# Patient Record
Sex: Male | Born: 1950
Health system: Southern US, Community
[De-identification: ages and names within clinical notes are randomized; demographics above are authoritative.]

## PROBLEM LIST (undated history)

## (undated) DIAGNOSIS — R011 Cardiac murmur, unspecified: Secondary | ICD-10-CM

## (undated) DIAGNOSIS — I10 Essential (primary) hypertension: Secondary | ICD-10-CM

## (undated) HISTORY — PX: CARDIAC CATHETERIZATION: SHX172

## (undated) HISTORY — PX: HERNIA REPAIR: SHX51

---

## 2004-11-04 ENCOUNTER — Encounter: Payer: Self-pay | Admitting: Cardiology

## 2004-11-04 ENCOUNTER — Ambulatory Visit (HOSPITAL_COMMUNITY): Admission: RE | Admit: 2004-11-04 | Discharge: 2004-11-04 | Payer: Self-pay | Admitting: Cardiology

## 2006-01-10 ENCOUNTER — Other Ambulatory Visit: Admission: RE | Admit: 2006-01-10 | Discharge: 2006-01-10 | Payer: Self-pay | Admitting: Otolaryngology

## 2010-02-23 ENCOUNTER — Ambulatory Visit (HOSPITAL_BASED_OUTPATIENT_CLINIC_OR_DEPARTMENT_OTHER): Admission: RE | Admit: 2010-02-23 | Discharge: 2010-02-23 | Payer: Self-pay | Admitting: Surgery

## 2010-08-12 LAB — BASIC METABOLIC PANEL
BUN: 13 mg/dL (ref 6–23)
Calcium: 9 mg/dL (ref 8.4–10.5)
Creatinine, Ser: 0.77 mg/dL (ref 0.4–1.5)
GFR calc non Af Amer: >60 mL/min (ref >60)
Glucose, Bld: 89 mg/dL (ref 70–99)

## 2010-08-12 LAB — DIFFERENTIAL
Basophils Absolute: 0 10*3/uL (ref 0.0–0.1)
Basophils Relative: 0 % (ref 0–1)
Eosinophils Absolute: 0 10*3/uL (ref 0.0–0.7)
Monocytes Absolute: 0.4 10*3/uL (ref 0.1–1.0)
Neutro Abs: 5 10*3/uL (ref 1.7–7.7)
Neutrophils Relative %: 75 % (ref 43–77)

## 2010-08-12 LAB — CBC
MCHC: 35 g/dL (ref 30.0–36.0)
RDW: 13.1 % (ref 11.5–15.5)

## 2010-10-15 NOTE — Cardiovascular Report (Signed)
Daniel Krueger, Daniel Krueger              ACCOUNT NO.:  [id number]   MEDICAL RECORD NO.:  [id number]          PATIENT TYPE:  OIB   LOCATION:  2859                         FACILITY:  [facility]   PHYSICIAN:  Armanda Magic, M.D.     DATE OF BIRTH:  1950/10/24   DATE OF PROCEDURE:  11/04/2004  DATE OF DISCHARGE:  11/04/2004                              CARDIAC CATHETERIZATION   REFERRING PHYSICIAN:  Vikki Ports, M.D.   PROCEDURE:  1.  Left heart catheterization.  2.  Coronary angiography.  3.  Left ventriculography.   OPERATOR:  Armanda Magic, M.D.   INDICATIONS:  Abnormal EKG and Cardiolite.   COMPLICATIONS:  None.   IV ACCESS:  Via right femoral artery 6-French sheath.   HISTORY:  This is a very pleasant 60 year old white male with no previous  cardiac history, who presented for evaluation of abnormal EKG and stress  Cardiolite study.  These showed a reversible perfusion defect.  The patient  now presents for cardiac catheterization.   DESCRIPTION OF PROCEDURE:  The patient is brought to cardiac catheterization  laboratory in a fasting nonsedated state. Informed consent was obtained. The  patient was connected to continuous heart rate and pulse oximetry  monitoring, intermittent blood pressure monitoring. The right groin was  prepped and draped in a sterile fashion.  Xylocaine 1%  was used for local  anesthesia. Using the modified Seldinger technique, a 6-French sheath was  placed in the right femoral artery. Under fluoroscopic guidance, a 6-French  JL-4 catheter was placed in the left coronary artery. Multiple  cineangiographic films were taken at 30-degree RAO and 40-degree LAO views.  This catheter was then exchanged out over a guidewire for 6-French JR-4  catheter, which was placed under fluoroscopic guidance to the right coronary  artery. Multiple cineangiographic films were taken in 30-degree RAO and 40-  degree LAO views. This catheter was exchanged out over a guidewire for  a 6-  French angled pigtail catheter; which was placed under fluoroscopic guidance  into the left ventricular cavity.   Left ventriculography was performed in a 30-degree RAO view, using a total  of 30 cc of contrast at 50 cc per second. Catheter was then pulled back  across the aortic valve, with no significant gradient noted. At the end of  the procedure all catheters and sheaths were removed. Manual compression was  performed until adequate hemostasis was obtained. The patient was  transferred back to the room in stable condition.   RESULTS:  1.  LEFT MAIN CORONARY ARTERY:  Is widely patent and trifurcates into a LAD,      ramus branch and left circumflex.  2.  LEFT ANTERIOR DESCENDING ARTERY:  Is widely patent throughout its course      to the apex, giving rise to a first diagonal which is large and widely      patent; bifurcates into two daughter branches.  3.  RAMUS INTERMEDIUS BRANCH:  Widely patent.  4.  LEFT CIRCUMFLEX:  Widely patent throughout its course, giving rise to      two obtuse marginal branches -- both of which  are widely patent and      terminating in a posterior descending artery which is widely patent.  5.  RIGHT CORONARY ARTERY:  Nondominant and widely patent and bifurcates      distally in acute marginal branch and posterolateral branch -- all of      which are widely patent.   LEFT VENTRICULOGRAPHY:  Shows normal LV systolic function; EF 55%. LV  pressure 139/11 mmHg.  Aortic pressure 138/89 mmHg.   ASSESSMENT:  1.  Abnormal EKG and stress Cardiolite.  2.  Normal coronary arteries.  3.  Normal left ventricular function.   PLAN:  Discharge to home after IV fluids and  bedrest.  Follow up with nurse  practitioner in the office in two weeks for groin check.       TT/MEDQ  D:  11/05/2004  T:  11/05/2004  Job:  [id number]   cc:   Vikki Ports, M.D.  [address] [place]  [place]  [place] [address]  Fax: [phone]

## 2011-12-13 ENCOUNTER — Ambulatory Visit: Payer: Federal, State, Local not specified - PPO | Attending: Family Medicine | Admitting: Physical Therapy

## 2011-12-13 DIAGNOSIS — M542 Cervicalgia: Secondary | ICD-10-CM | POA: Insufficient documentation

## 2011-12-13 DIAGNOSIS — IMO0001 Reserved for inherently not codable concepts without codable children: Secondary | ICD-10-CM | POA: Insufficient documentation

## 2011-12-13 DIAGNOSIS — M25619 Stiffness of unspecified shoulder, not elsewhere classified: Secondary | ICD-10-CM | POA: Insufficient documentation

## 2011-12-13 DIAGNOSIS — M25519 Pain in unspecified shoulder: Secondary | ICD-10-CM | POA: Insufficient documentation

## 2011-12-16 ENCOUNTER — Ambulatory Visit: Payer: Federal, State, Local not specified - PPO | Admitting: Physical Therapy

## 2011-12-21 ENCOUNTER — Ambulatory Visit: Payer: Federal, State, Local not specified - PPO | Admitting: Physical Therapy

## 2011-12-23 ENCOUNTER — Encounter: Payer: Federal, State, Local not specified - PPO | Admitting: Physical Therapy

## 2011-12-27 ENCOUNTER — Ambulatory Visit: Payer: Federal, State, Local not specified - PPO | Admitting: Physical Therapy

## 2011-12-29 ENCOUNTER — Encounter: Payer: Federal, State, Local not specified - PPO | Admitting: Physical Therapy

## 2012-01-03 ENCOUNTER — Encounter: Payer: Federal, State, Local not specified - PPO | Admitting: Physical Therapy

## 2012-01-05 ENCOUNTER — Encounter: Payer: Federal, State, Local not specified - PPO | Admitting: Physical Therapy

## 2015-06-06 ENCOUNTER — Emergency Department (HOSPITAL_COMMUNITY): Payer: Federal, State, Local not specified - PPO

## 2015-06-06 ENCOUNTER — Encounter (HOSPITAL_COMMUNITY): Payer: Self-pay

## 2015-06-06 ENCOUNTER — Emergency Department (HOSPITAL_COMMUNITY)
Admission: EM | Admit: 2015-06-06 | Discharge: 2015-06-06 | Disposition: A | Discharge status: Home / Self Care | Payer: Federal, State, Local not specified - PPO | Attending: Emergency Medicine | Admitting: Emergency Medicine

## 2015-06-06 DIAGNOSIS — Z87891 Personal history of nicotine dependence: Secondary | ICD-10-CM | POA: Diagnosis not present

## 2015-06-06 DIAGNOSIS — I10 Essential (primary) hypertension: Secondary | ICD-10-CM | POA: Diagnosis not present

## 2015-06-06 DIAGNOSIS — Z9889 Other specified postprocedural states: Secondary | ICD-10-CM | POA: Insufficient documentation

## 2015-06-06 DIAGNOSIS — R0602 Shortness of breath: Secondary | ICD-10-CM | POA: Diagnosis present

## 2015-06-06 DIAGNOSIS — J069 Acute upper respiratory infection, unspecified: Secondary | ICD-10-CM | POA: Insufficient documentation

## 2015-06-06 HISTORY — DX: Essential (primary) hypertension: I10

## 2015-06-06 LAB — CBC
HCT: 44.9 % (ref 39.0–52.0)
Hemoglobin: 15.5 g/dL (ref 13.0–17.0)
MCH: 31.6 pg (ref 26.0–34.0)
MCHC: 34.5 g/dL (ref 30.0–36.0)
MCV: 91.4 fL (ref 78.0–100.0)
Platelets: 217 10*3/uL (ref 150–400)
RBC: 4.91 MIL/uL (ref 4.22–5.81)
RDW: 13.4 % (ref 11.5–15.5)
WBC: 7.6 10*3/uL (ref 4.0–10.5)

## 2015-06-06 LAB — COMPREHENSIVE METABOLIC PANEL
ALBUMIN: 4.4 g/dL (ref 3.5–5.0)
ALK PHOS: 85 U/L (ref 38–126)
ALT: 24 U/L (ref 17–63)
ANION GAP: 10 (ref 5–15)
AST: 24 U/L (ref 15–41)
BILIRUBIN TOTAL: 0.5 mg/dL (ref 0.3–1.2)
BUN: 17 mg/dL (ref 6–20)
CALCIUM: 9.5 mg/dL (ref 8.9–10.3)
CO2: 27 mmol/L (ref 22–32)
CREATININE: 1.14 mg/dL (ref 0.61–1.24)
Chloride: 104 mmol/L (ref 101–111)
GFR calc non Af Amer: >60 mL/min (ref >60)
GLUCOSE: 148 mg/dL — AB (ref 65–99)
POTASSIUM: 4.7 mmol/L (ref 3.5–5.1)
Sodium: 141 mmol/L (ref 135–145)
Total Protein: 7.4 g/dL (ref 6.5–8.1)

## 2015-06-06 LAB — I-STAT TROPONIN, ED: Troponin i, poc: 0 ng/mL (ref 0.00–0.08)

## 2015-06-06 MED ORDER — SODIUM CHLORIDE 0.9 % IV BOLUS (SEPSIS)
1000.0000 mL | Freq: Once | INTRAVENOUS | Status: AC
  Administered 2015-06-06: 1000 mL via INTRAVENOUS

## 2015-06-06 NOTE — ED Provider Notes (Signed)
CSN: [id number]     Arrival date & time 06/06/15  2115 History   First MD Initiated Contact with Patient 06/06/15 2132     Chief Complaint  Patient presents with  . Shortness of Breath     (Consider location/radiation/quality/duration/timing/severity/associated sxs/prior Treatment) Patient is a 66 y.o. male presenting with shortness of breath and general illness. The history is provided by the patient.  Shortness of Breath Associated symptoms: cough   Associated symptoms: no abdominal pain, no chest pain, no fever, no headaches, no rash and no vomiting   Illness Severity:  Moderate Onset quality:  Sudden Duration:  2 days Timing:  Constant Progression:  Worsening Chronicity:  New Associated symptoms: congestion, cough, fatigue and shortness of breath   Associated symptoms: no abdominal pain, no chest pain, no diarrhea, no fever, no headaches, no myalgias, no rash and no vomiting     65 yo M with a chief complaint of the feeling like he needs clears throat. This been going on for about a month. Patient with continued symptoms thinks that they are worsening. Feels like he has trouble taking a deep breath. Patient denies any hemoptysis any recent travel history of surgery. Patient denies any prior history of blood clots. Patient denies any recent injury. Symptoms initially started after a URI. Denies history of reflux disease  Past Medical History  Diagnosis Date  . Hypertension    Past Surgical History  Procedure Laterality Date  . Hernia repair    . Cardiac catheterization      "they found nothing"   No family history on file. Social History  Substance Use Topics  . Smoking status: Former Smoker    Quit date: 06/06/1983  . Smokeless tobacco: None  . Alcohol Use: No    Review of Systems  Constitutional: Positive for fatigue. Negative for fever and chills.  HENT: Positive for congestion. Negative for facial swelling.   Eyes: Negative for discharge and visual disturbance.   Respiratory: Positive for cough and shortness of breath.   Cardiovascular: Negative for chest pain and palpitations.  Gastrointestinal: Negative for vomiting, abdominal pain and diarrhea.  Musculoskeletal: Negative for myalgias and arthralgias.  Skin: Negative for color change and rash.  Neurological: Negative for tremors, syncope and headaches.  Psychiatric/Behavioral: Negative for confusion and dysphoric mood.      Allergies  Review of patient's allergies indicates not on file.  Home Medications   Prior to Admission medications   Not on File   BP 159/89 mmHg  Pulse 99  Temp(Src) 97.9 F (36.6 C) (Oral)  Resp 15  SpO2 97% Physical Exam  Constitutional: He is oriented to person, place, and time. He appears well-developed and well-nourished.  HENT:  Head: Normocephalic and atraumatic.  Swollen turbinates, post nasal drip  Eyes: EOM are normal. Pupils are equal, round, and reactive to light.  Neck: Normal range of motion. Neck supple. No JVD present.  Cardiovascular: Normal rate and regular rhythm.  Exam reveals no gallop and no friction rub.   No murmur heard. Pulmonary/Chest: No respiratory distress. He has no wheezes. He has no rales.  Abdominal: He exhibits no distension. There is no tenderness. There is no rebound and no guarding.  Musculoskeletal: Normal range of motion.  Neurological: He is alert and oriented to person, place, and time.  Skin: No rash noted. No pallor.  Psychiatric: He has a normal mood and affect. His behavior is normal.  Nursing note and vitals reviewed.   ED Course  Procedures (including critical  care time) Labs Review Labs Reviewed  CBC  COMPREHENSIVE METABOLIC PANEL  I-STAT [place], ED    Imaging Review No results found. I have personally reviewed and evaluated these images and lab results as part of my medical decision-making.   EKG Interpretation   Date/Time:  Saturday June 06 2015 21:20:53 EST Ventricular Rate:  106 PR  Interval:  180 QRS Duration: 80 QT Interval:  330 QTC Calculation: 438 R Axis:   -41 Text Interpretation:  Sinus tachycardia with Premature ventricular  complexes or Fusion complexes Left axis deviation Inferior infarct , age  undetermined Abnormal ECG flipped t wave in avl not seen on prior  Otherwise no significant change Confirmed by Brittain Smithey MD, Quillian Quince [id number]) on  06/06/2015 9:34:18 PM      MDM   Final diagnoses:  URI (upper respiratory infection)    65 yo M with a cc of feeling that he needs clears throat. Patient with clear lung sounds on my exam setting of her percent on room air. Mild tachycardia on arrival. CMP was obtained with no significant anion gap. Tachycardia resolved with IV fluids. Low risk wells score. Spouse is concerned because of his sensation that he can't breathe is convinced that he has bronchitis and feels that antibiotics are warranted. Discussed risks and benefits antibiotics and how it's unlikely that this is caused by bacteria. Will have the patient follow-up with his family doctor. With persistent need to clear his throat discussed going on Zantac for possible reflux as well as taking Claritin for some symptomatically relief for postnasal drip.  10:34 PM:  I have discussed the diagnosis/risks/treatment options with the patient and family and believe the pt to be eligible for discharge home to follow-up with PCP. We also discussed returning to the ED immediately if new or worsening sx occur. We discussed the sx which are most concerning (e.g., sudden worsening sob, syncope) that necessitate immediate return. Medications administered to the patient during their visit and any new prescriptions provided to the patient are listed below.  Medications given during this visit Medications  sodium chloride 0.9 % bolus 1,000 mL (1,000 mLs Intravenous New Bag/Given 06/06/15 2142)    New Prescriptions   No medications on file    The patient appears reasonably screen and/or  stabilized for discharge and I doubt any other medical condition or other [facility] requiring further screening, evaluation, or treatment in the ED at this time prior to discharge.      Deno Etienne, DO 06/07/15 1359

## 2015-06-06 NOTE — ED Notes (Addendum)
Pt here with c/o SOB and productive cough of green phlegm, onset one week ago. He states he has had a cold this week that he thinks he got from his grandchildren. Pt does endorse chest tightness with his SOB but denies being in any pain.

## 2015-06-06 NOTE — Discharge Instructions (Signed)
Try Claritin to help dry up your secretions.  Try zantac for possible reflux.  Drink plenty of fluids to help thin your secretions.  Follow up with your doctor.  Return for sudden worsening or if you pass out.  Upper Respiratory Infection, Adult Most upper respiratory infections (URIs) are a viral infection of the air passages leading to the lungs. A URI affects the nose, throat, and upper air passages. The most common type of URI is nasopharyngitis and is typically referred to as "the common cold." URIs run their course and usually go away on their own. Most of the time, a URI does not require medical attention, but sometimes a bacterial infection in the upper airways can follow a viral infection. This is called a secondary infection. Sinus and middle ear infections are common types of secondary upper respiratory infections. Bacterial pneumonia can also complicate a URI. A URI can worsen asthma and chronic obstructive pulmonary disease (COPD). Sometimes, these complications can require emergency medical care and may be life threatening.  CAUSES Almost all URIs are caused by viruses. A virus is a type of germ and can spread from one person to another.  RISKS FACTORS You may be at risk for a URI if:   You smoke.   You have chronic heart or lung disease.  You have a weakened defense (immune) system.   You are very young or very old.   You have nasal allergies or asthma.  You work in crowded or poorly ventilated areas.  You work in health care facilities or schools. SIGNS AND SYMPTOMS  Symptoms typically develop 2-3 days after you come in contact with a cold virus. Most viral URIs last 7-10 days. However, viral URIs from the influenza virus (flu virus) can last 14-18 days and are typically more severe. Symptoms may include:   Runny or stuffy (congested) nose.   Sneezing.   Cough.   Sore throat.   Headache.   Fatigue.   Fever.   Loss of appetite.   Pain in your  forehead, behind your eyes, and over your cheekbones (sinus pain).  Muscle aches.  DIAGNOSIS  Your health care provider may diagnose a URI by:  Physical exam.  Tests to check that your symptoms are not due to another condition such as:  Strep throat.  Sinusitis.  Pneumonia.  Asthma. TREATMENT  A URI goes away on its own with time. It cannot be cured with medicines, but medicines may be prescribed or recommended to relieve symptoms. Medicines may help:  Reduce your fever.  Reduce your cough.  Relieve nasal congestion. HOME CARE INSTRUCTIONS   Take medicines only as directed by your health care provider.   Gargle warm saltwater or take cough drops to comfort your throat as directed by your health care provider.  Use a warm mist humidifier or inhale steam from a shower to increase air moisture. This may make it easier to breathe.  Drink enough fluid to keep your urine clear or pale yellow.   Eat soups and other clear broths and maintain good nutrition.   Rest as needed.   Return to work when your temperature has returned to normal or as your health care provider advises. You may need to stay home longer to avoid infecting others. You can also use a face mask and careful hand washing to prevent spread of the virus.  Increase the usage of your inhaler if you have asthma.   Do not use any tobacco products, including cigarettes, chewing tobacco,  or electronic cigarettes. If you need help quitting, ask your health care provider. PREVENTION  The best way to protect yourself from getting a cold is to practice good hygiene.   Avoid oral or hand contact with people with cold symptoms.   Wash your hands often if contact occurs.  There is no clear evidence that vitamin C, vitamin E, echinacea, or exercise reduces the chance of developing a cold. However, it is always recommended to get plenty of rest, exercise, and practice good nutrition.  SEEK MEDICAL CARE IF:   You  are getting worse rather than better.   Your symptoms are not controlled by medicine.   You have chills.  You have worsening shortness of breath.  You have brown or red mucus.  You have yellow or brown nasal discharge.  You have pain in your face, especially when you bend forward.  You have a fever.  You have swollen neck glands.  You have pain while swallowing.  You have white areas in the back of your throat. SEEK IMMEDIATE MEDICAL CARE IF:   You have severe or persistent:  Headache.  Ear pain.  Sinus pain.  Chest pain.  You have chronic lung disease and any of the following:  Wheezing.  Prolonged cough.  Coughing up blood.  A change in your usual mucus.  You have a stiff neck.  You have changes in your:  Vision.  Hearing.  Thinking.  Mood. MAKE SURE YOU:   Understand these instructions.  Will watch your condition.  Will get help right away if you are not doing well or get worse.   This information is not intended to replace advice given to you by your health care provider. Make sure you discuss any questions you have with your health care provider.   Document Released: 11/09/2000 Document Revised: 09/30/2014 Document Reviewed: 08/21/2013 Elsevier Interactive Patient Education [employer].

## 2015-08-28 DIAGNOSIS — D170 Benign lipomatous neoplasm of skin and subcutaneous tissue of head, face and neck: Secondary | ICD-10-CM | POA: Insufficient documentation

## 2015-08-28 DIAGNOSIS — R590 Localized enlarged lymph nodes: Secondary | ICD-10-CM | POA: Insufficient documentation

## 2016-01-05 DIAGNOSIS — R079 Chest pain, unspecified: Secondary | ICD-10-CM | POA: Diagnosis not present

## 2016-01-12 DIAGNOSIS — R0789 Other chest pain: Secondary | ICD-10-CM | POA: Diagnosis not present

## 2016-01-12 DIAGNOSIS — R42 Dizziness and giddiness: Secondary | ICD-10-CM | POA: Diagnosis not present

## 2016-01-12 DIAGNOSIS — E785 Hyperlipidemia, unspecified: Secondary | ICD-10-CM | POA: Diagnosis not present

## 2016-01-12 DIAGNOSIS — E668 Other obesity: Secondary | ICD-10-CM | POA: Diagnosis not present

## 2016-01-12 DIAGNOSIS — I10 Essential (primary) hypertension: Secondary | ICD-10-CM | POA: Diagnosis not present

## 2016-01-19 DIAGNOSIS — R079 Chest pain, unspecified: Secondary | ICD-10-CM | POA: Diagnosis not present

## 2016-01-25 ENCOUNTER — Encounter: Payer: Self-pay | Admitting: Cardiology

## 2016-01-25 DIAGNOSIS — I10 Essential (primary) hypertension: Secondary | ICD-10-CM | POA: Diagnosis not present

## 2016-02-08 DIAGNOSIS — E7800 Pure hypercholesterolemia, unspecified: Secondary | ICD-10-CM | POA: Diagnosis not present

## 2016-02-08 DIAGNOSIS — I10 Essential (primary) hypertension: Secondary | ICD-10-CM | POA: Diagnosis not present

## 2016-08-09 DIAGNOSIS — Z23 Encounter for immunization: Secondary | ICD-10-CM | POA: Diagnosis not present

## 2016-08-09 DIAGNOSIS — E559 Vitamin D deficiency, unspecified: Secondary | ICD-10-CM | POA: Diagnosis not present

## 2016-08-09 DIAGNOSIS — E7800 Pure hypercholesterolemia, unspecified: Secondary | ICD-10-CM | POA: Diagnosis not present

## 2016-08-09 DIAGNOSIS — I10 Essential (primary) hypertension: Secondary | ICD-10-CM | POA: Diagnosis not present

## 2016-08-09 DIAGNOSIS — Z0000 Encounter for general adult medical examination without abnormal findings: Secondary | ICD-10-CM | POA: Diagnosis not present

## 2016-08-09 DIAGNOSIS — Z125 Encounter for screening for malignant neoplasm of prostate: Secondary | ICD-10-CM | POA: Diagnosis not present

## 2016-11-09 DIAGNOSIS — M62830 Muscle spasm of back: Secondary | ICD-10-CM | POA: Diagnosis not present

## 2016-11-09 DIAGNOSIS — I10 Essential (primary) hypertension: Secondary | ICD-10-CM | POA: Diagnosis not present

## 2017-02-13 DIAGNOSIS — I10 Essential (primary) hypertension: Secondary | ICD-10-CM | POA: Diagnosis not present

## 2017-02-13 DIAGNOSIS — E7800 Pure hypercholesterolemia, unspecified: Secondary | ICD-10-CM | POA: Diagnosis not present

## 2017-03-20 DIAGNOSIS — K921 Melena: Secondary | ICD-10-CM | POA: Diagnosis not present

## 2017-07-20 DIAGNOSIS — D123 Benign neoplasm of transverse colon: Secondary | ICD-10-CM | POA: Diagnosis not present

## 2017-07-20 DIAGNOSIS — D126 Benign neoplasm of colon, unspecified: Secondary | ICD-10-CM | POA: Diagnosis not present

## 2017-07-20 DIAGNOSIS — Z1211 Encounter for screening for malignant neoplasm of colon: Secondary | ICD-10-CM | POA: Diagnosis not present

## 2017-07-20 DIAGNOSIS — Z86010 Personal history of colonic polyps: Secondary | ICD-10-CM | POA: Diagnosis not present

## 2017-08-01 DIAGNOSIS — D170 Benign lipomatous neoplasm of skin and subcutaneous tissue of head, face and neck: Secondary | ICD-10-CM | POA: Diagnosis not present

## 2017-08-01 DIAGNOSIS — M62830 Muscle spasm of back: Secondary | ICD-10-CM | POA: Diagnosis not present

## 2017-08-16 DIAGNOSIS — I10 Essential (primary) hypertension: Secondary | ICD-10-CM | POA: Diagnosis not present

## 2017-08-16 DIAGNOSIS — Z0000 Encounter for general adult medical examination without abnormal findings: Secondary | ICD-10-CM | POA: Diagnosis not present

## 2017-08-16 DIAGNOSIS — Z125 Encounter for screening for malignant neoplasm of prostate: Secondary | ICD-10-CM | POA: Diagnosis not present

## 2017-08-16 DIAGNOSIS — E559 Vitamin D deficiency, unspecified: Secondary | ICD-10-CM | POA: Diagnosis not present

## 2017-08-16 DIAGNOSIS — Z1159 Encounter for screening for other viral diseases: Secondary | ICD-10-CM | POA: Diagnosis not present

## 2017-08-16 DIAGNOSIS — Z23 Encounter for immunization: Secondary | ICD-10-CM | POA: Diagnosis not present

## 2017-08-16 DIAGNOSIS — J302 Other seasonal allergic rhinitis: Secondary | ICD-10-CM | POA: Diagnosis not present

## 2017-08-16 DIAGNOSIS — E7800 Pure hypercholesterolemia, unspecified: Secondary | ICD-10-CM | POA: Diagnosis not present

## 2017-11-03 DIAGNOSIS — W57XXXA Bitten or stung by nonvenomous insect and other nonvenomous arthropods, initial encounter: Secondary | ICD-10-CM | POA: Diagnosis not present

## 2017-11-03 DIAGNOSIS — S70362A Insect bite (nonvenomous), left thigh, initial encounter: Secondary | ICD-10-CM | POA: Diagnosis not present

## 2018-02-15 DIAGNOSIS — I10 Essential (primary) hypertension: Secondary | ICD-10-CM | POA: Diagnosis not present

## 2018-02-15 DIAGNOSIS — E7800 Pure hypercholesterolemia, unspecified: Secondary | ICD-10-CM | POA: Diagnosis not present

## 2018-07-03 DIAGNOSIS — M542 Cervicalgia: Secondary | ICD-10-CM | POA: Diagnosis not present

## 2018-08-14 DIAGNOSIS — J392 Other diseases of pharynx: Secondary | ICD-10-CM | POA: Diagnosis not present

## 2018-08-14 DIAGNOSIS — R1310 Dysphagia, unspecified: Secondary | ICD-10-CM | POA: Diagnosis not present

## 2018-08-14 DIAGNOSIS — Z20828 Contact with and (suspected) exposure to other viral communicable diseases: Secondary | ICD-10-CM | POA: Diagnosis not present

## 2018-08-14 DIAGNOSIS — M25511 Pain in right shoulder: Secondary | ICD-10-CM | POA: Diagnosis not present

## 2018-08-23 DIAGNOSIS — E7800 Pure hypercholesterolemia, unspecified: Secondary | ICD-10-CM | POA: Diagnosis not present

## 2018-08-23 DIAGNOSIS — Z125 Encounter for screening for malignant neoplasm of prostate: Secondary | ICD-10-CM | POA: Diagnosis not present

## 2018-08-23 DIAGNOSIS — M542 Cervicalgia: Secondary | ICD-10-CM | POA: Diagnosis not present

## 2018-08-23 DIAGNOSIS — Z0000 Encounter for general adult medical examination without abnormal findings: Secondary | ICD-10-CM | POA: Diagnosis not present

## 2018-08-23 DIAGNOSIS — I10 Essential (primary) hypertension: Secondary | ICD-10-CM | POA: Diagnosis not present

## 2018-08-23 DIAGNOSIS — Z131 Encounter for screening for diabetes mellitus: Secondary | ICD-10-CM | POA: Diagnosis not present

## 2018-09-28 DIAGNOSIS — L03011 Cellulitis of right finger: Secondary | ICD-10-CM | POA: Diagnosis not present

## 2018-12-06 DIAGNOSIS — R0789 Other chest pain: Secondary | ICD-10-CM | POA: Diagnosis not present

## 2018-12-06 DIAGNOSIS — R42 Dizziness and giddiness: Secondary | ICD-10-CM | POA: Diagnosis not present

## 2018-12-06 DIAGNOSIS — I10 Essential (primary) hypertension: Secondary | ICD-10-CM | POA: Diagnosis not present

## 2018-12-14 ENCOUNTER — Ambulatory Visit (INDEPENDENT_AMBULATORY_CARE_PROVIDER_SITE_OTHER): Payer: Medicare Other | Admitting: Cardiology

## 2018-12-14 ENCOUNTER — Other Ambulatory Visit: Payer: Self-pay

## 2018-12-14 ENCOUNTER — Encounter: Payer: Self-pay | Admitting: Cardiology

## 2018-12-14 VITALS — BP 142/78 | HR 92 | Ht 70.0 in | Wt 218.0 lb

## 2018-12-14 DIAGNOSIS — R0789 Other chest pain: Secondary | ICD-10-CM

## 2018-12-14 DIAGNOSIS — R079 Chest pain, unspecified: Secondary | ICD-10-CM

## 2018-12-14 DIAGNOSIS — E782 Mixed hyperlipidemia: Secondary | ICD-10-CM

## 2018-12-14 DIAGNOSIS — I10 Essential (primary) hypertension: Secondary | ICD-10-CM | POA: Diagnosis not present

## 2018-12-14 NOTE — Addendum Note (Signed)
Addended by: Beckey Rutter on: 12/14/2018 12:13 PM   Modules accepted: Orders

## 2018-12-14 NOTE — Patient Instructions (Addendum)
Medication Instructions:  Your physician recommends that you continue on your current medications as directed. Please refer to the Current Medication list given to you today.  If you need a refill on your cardiac medications before your next appointment, please call your pharmacy.   Lab work: NONE If you have labs (blood work) drawn today and your tests are completely normal, you will receive your results only by: Marland Kitchen MyChart Message (if you have MyChart) OR . A paper copy in the mail If you have any lab test that is abnormal or we need to change your treatment, we will call you to review the results.  Testing/Procedures: YOU had an EKG performed today  Your physician has requested that you have a lexiscan myoview. For further information please visit [email or web address]. Please follow instruction sheet, as given.    Follow-Up: At [facility], you and your health needs are our priority.  As part of our continuing mission to provide you with exceptional heart care, we have created designated Provider Care Teams.  These Care Teams include your primary Cardiologist (physician) and Advanced Practice Providers (APPs -  Physician Assistants and Nurse Practitioners) who all work together to provide you with the care you need, when you need it. You will need a follow up appointment in 6 months.    Any Other Special Instructions Will Be Listed Below  Regadenoson injection What is this medicine? REGADENOSON is used to test the heart for coronary artery disease. It is used in patients who can not exercise for their stress test. This medicine may be used for other purposes; ask your health care provider or pharmacist if you have questions. COMMON BRAND NAME(S): Lexiscan What should I tell my health care provider before I take this medicine? They need to know if you have any of these conditions:  heart problems  lung or breathing disease, like asthma or COPD  an unusual or allergic reaction  to regadenoson, other medicines, foods, dyes, or preservatives  pregnant or trying to get pregnant  breast-feeding How should I use this medicine? This medicine is for injection into a vein. It is given by a health care professional in a hospital or clinic setting. Talk to your pediatrician regarding the use of this medicine in children. Special care may be needed. Overdosage: If you think you have taken too much of this medicine contact a poison control center or emergency room at once. NOTE: This medicine is only for you. Do not share this medicine with others. What if I miss a dose? This does not apply. What may interact with this medicine?  caffeine  dipyridamole  guarana  theophylline This list may not describe all possible interactions. Give your health care provider a list of all the medicines, herbs, non-prescription drugs, or dietary supplements you use. Also tell them if you smoke, drink alcohol, or use illegal drugs. Some items may interact with your medicine. What should I watch for while using this medicine? Your condition will be monitored carefully while you are receiving this medicine. Do not take medicines, foods, or drinks with caffeine (like coffee, tea, or colas) for at least 12 hours before your test. If you do not know if something contains caffeine, ask your health care professional. What side effects may I notice from receiving this medicine? Side effects that you should report to your doctor or health care professional as soon as possible:  allergic reactions like skin rash, itching or hives, swelling of the face, lips, or tongue  breathing problems  chest pain, tightness or palpitations  severe headache Side effects that usually do not require medical attention (report to your doctor or health care professional if they continue or are bothersome):  flushing  headache  irritation or pain at site where injected  nausea, vomiting This list may not  describe all possible side effects. Call your doctor for medical advice about side effects. You may report side effects to FDA at 1-800-FDA-1088. Where should I keep my medicine? This drug is given in a hospital or clinic and will not be stored at home. NOTE: This sheet is a summary. It may not cover all possible information. If you have questions about this medicine, talk to your doctor, pharmacist, or health care provider.  2020 Elsevier/Gold Standard (2008-01-14 15:08:13)  Cardiac Nuclear Scan A cardiac nuclear scan is a test that is done to check the flow of blood to your heart. It is done when you are resting and when you are exercising. The test looks for problems such as:  Not enough blood reaching a portion of the heart.  The heart muscle not working as it should. You may need this test if:  You have heart disease.  You have had lab results that are not normal.  You have had heart surgery or a balloon procedure to open up blocked arteries (angioplasty).  You have chest pain.  You have shortness of breath. In this test, a special dye (tracer) is put into your bloodstream. The tracer will travel to your heart. A camera will then take pictures of your heart to see how the tracer moves through your heart. This test is usually done at a hospital and takes 2-4 hours. Tell a doctor about:  Any allergies you have.  All medicines you are taking, including vitamins, herbs, eye drops, creams, and over-the-counter medicines.  Any problems you or family members have had with anesthetic medicines.  Any blood disorders you have.  Any surgeries you have had.  Any medical conditions you have.  Whether you are pregnant or may be pregnant. What are the risks? Generally, this is a safe test. However, problems may occur, such as:  Serious chest pain and heart attack. This is only a risk if the stress portion of the test is done.  Rapid heartbeat.  A feeling of warmth in your chest.  This feeling usually does not last long.  Allergic reaction to the tracer. What happens before the test?  Ask your doctor about changing or stopping your normal medicines. This is important.  Follow instructions from your doctor about what you cannot eat or drink.  Remove your jewelry on the day of the test. What happens during the test?  An IV tube will be inserted into one of your veins.  Your doctor will give you a small amount of tracer through the IV tube.  You will wait for 20-40 minutes while the tracer moves through your bloodstream.  Your heart will be monitored with an electrocardiogram (ECG).  You will lie down on an exam table.  Pictures of your heart will be taken for about 15-20 minutes.  You may also have a stress test. For this test, one of these things may be done: ? You will be asked to exercise on a treadmill or a stationary bike. ? You will be given medicines that will make your heart work harder. This is done if you are unable to exercise.  When blood flow to your heart has peaked, a  tracer will again be given through the IV tube.  After 20-40 minutes, you will get back on the exam table. More pictures will be taken of your heart.  Depending on the tracer that is used, more pictures may need to be taken 3-4 hours later.  Your IV tube will be removed when the test is over. The test may vary among doctors and hospitals. What happens after the test?  Ask your doctor: ? Whether you can return to your normal schedule, including diet, activities, and medicines. ? Whether you should drink more fluids. This will help to remove the tracer from your body. Drink enough fluid to keep your pee (urine) pale yellow.  Ask your doctor, or the department that is doing the test: ? When will my results be ready? ? How will I get my results? Summary  A cardiac nuclear scan is a test that is done to check the flow of blood to your heart.  Tell your doctor whether you  are pregnant or may be pregnant.  Before the test, ask your doctor about changing or stopping your normal medicines. This is important.  Ask your doctor whether you can return to your normal activities. You may be asked to drink more fluids. This information is not intended to replace advice given to you by your health care provider. Make sure you discuss any questions you have with your health care provider. Document Released: 10/30/2017 Document Revised: 09/05/2018 Document Reviewed: 10/30/2017 Elsevier Patient Education  2020 [employer].

## 2018-12-14 NOTE — Progress Notes (Signed)
Cardiology Office Note:    Date:  12/14/2018   ID:  Daniel Krueger, DOB [birth year], MRN [id number]  PCP:  System, Pcp Not In  Cardiologist:  Jenean Lindau, MD   Referring MD: No ref. provider found    ASSESSMENT:    1. Chest discomfort   2. Mixed dyslipidemia   3. Essential hypertension    PLAN:    In order of problems listed above:  1. Chest discomfort: The chest discomfort is atypical for coronary etiology.  He has good effort tolerance but he wants some form of evaluation for the symptoms.  We will do a Lexiscan sestamibi to assess his symptoms. 2. Essential hypertension: Blood pressure stable 3. Mixed dyslipidemia lipid work done recently was fine and diet was discussed 4. Patient will be seen in follow-up appointment in 6 months or earlier if the patient has any concerns    Medication Adjustments/Labs and Tests Ordered: Current medicines are reviewed at length with the patient today.  Concerns regarding medicines are outlined above.  No orders of the defined types were placed in this encounter.  No orders of the defined types were placed in this encounter.    No chief complaint on file.    History of Present Illness:    Daniel Krueger is a 68 y.o. male.  Patient has history of essential hypertension and dyslipidemia.  He has been noticing occasional chest discomfort.  This is like a burning-like sensation.  No radiation to any part of the body.  Does not ox with activity.  He is a very active gentleman.  He is very concerned about the symptoms and therefore he is here.  At the time of my evaluation, the patient is alert awake oriented and in no distress.  Past Medical History:  Diagnosis Date  . Hypertension     Past Surgical History:  Procedure Laterality Date  . CARDIAC CATHETERIZATION     "they found nothing"  . HERNIA REPAIR      Current Medications: Current Meds  Medication Sig  . atorvastatin (LIPITOR) 20 MG tablet Take 20 mg by mouth daily.   Marland Kitchen lisinopril (ZESTRIL) 40 MG tablet Take 40 mg by mouth daily.     Allergies:   Patient has no known allergies.   Social History   Socioeconomic History  . Marital status: Married    Spouse name: Not on file  . Number of children: Not on file  . Years of education: Not on file  . Highest education level: Not on file  Occupational History  . Not on file  Social Needs  . Financial resource strain: Not on file  . Food insecurity    Worry: Not on file    Inability: Not on file  . Transportation needs    Medical: Not on file    Non-medical: Not on file  Tobacco Use  . Smoking status: Former Smoker    Quit date: 06/06/1983    Years since quitting: 35.5  . Smokeless tobacco: Never Used  Substance and Sexual Activity  . Alcohol use: No  . Drug use: Not on file  . Sexual activity: Not on file  Lifestyle  . Physical activity    Days per week: Not on file    Minutes per session: Not on file  . Stress: Not on file  Relationships  . Social Herbalist on phone: Not on file    Gets together: Not on file    Attends religious  service: Not on file    Active member of club or organization: Not on file    Attends meetings of clubs or organizations: Not on file    Relationship status: Not on file  Other Topics Concern  . Not on file  Social History Narrative  . Not on file     Family History: The patient's family history is not on file.  ROS:   Please see the history of present illness.    All other systems reviewed and are negative.  EKGs/Labs/Other Studies Reviewed:    The following studies were reviewed today: I reviewed lab work and EKG from primary care physician and notes extensively.  Coronary angiography in 2007 was unremarkable.   Recent Labs: No results found for requested labs within last 8760 hours.  Recent Lipid Panel No results found for: CHOL, TRIG, HDL, CHOLHDL, VLDL, LDLCALC, LDLDIRECT  Physical Exam:    VS:  BP (!) 142/78 (BP Location:  Left Arm, Patient Position: Sitting, Cuff Size: Normal)   Pulse 92   Ht 5\' 10"  (1.778 m)   Wt 218 lb (98.9 kg)   SpO2 98%   BMI 31.28 kg/m     Wt Readings from Last 3 Encounters:  12/14/18 218 lb (98.9 kg)     GEN: Patient is in no acute distress HEENT: Normal NECK: No JVD; No carotid bruits LYMPHATICS: No lymphadenopathy CARDIAC: Hear sounds regular, 2/6 systolic murmur at the apex. RESPIRATORY:  Clear to auscultation without rales, wheezing or rhonchi  ABDOMEN: Soft, non-tender, non-distended MUSCULOSKELETAL:  No edema; No deformity  SKIN: Warm and dry NEUROLOGIC:  Alert and oriented x 3 PSYCHIATRIC:  Normal affect   Signed, Jenean Lindau, MD  12/14/2018 10:57 AM    [place]

## 2018-12-17 NOTE — Addendum Note (Signed)
Addended by: Tarri Glenn on: 12/17/2018 09:01 AM   Modules accepted: Orders

## 2019-01-01 ENCOUNTER — Inpatient Hospital Stay (HOSPITAL_COMMUNITY): Admission: RE | Admit: 2019-01-01 | Payer: Medicare Other | Source: Ambulatory Visit

## 2019-03-08 DIAGNOSIS — Z23 Encounter for immunization: Secondary | ICD-10-CM | POA: Diagnosis not present

## 2019-04-01 DIAGNOSIS — I10 Essential (primary) hypertension: Secondary | ICD-10-CM | POA: Diagnosis not present

## 2019-06-29 ENCOUNTER — Ambulatory Visit: Payer: Medicare Other

## 2019-07-04 ENCOUNTER — Ambulatory Visit: Payer: Medicare Other

## 2019-07-06 ENCOUNTER — Ambulatory Visit: Payer: Medicare Other | Attending: Internal Medicine

## 2019-07-06 DIAGNOSIS — Z23 Encounter for immunization: Secondary | ICD-10-CM | POA: Insufficient documentation

## 2019-07-06 NOTE — Progress Notes (Signed)
   Covid-19 Vaccination Clinic  Name:  Daniel Krueger    MRN: [id number] DOB: [birth year]  07/06/2019  [name] was observed post Covid-19 immunization for 15 minutes without incidence. He was provided with Vaccine Information Sheet and instruction to access the V-Safe system.   [name] was instructed to call 911 with any severe reactions post vaccine: Marland Kitchen Difficulty breathing  . Swelling of your face and throat  . A fast heartbeat  . A bad rash all over your body  . Dizziness and weakness    Immunizations Administered    Name Date Dose VIS Date Route   Pfizer COVID-19 Vaccine 07/06/2019  4:17 PM 0.3 mL 05/10/2019 Intramuscular   Manufacturer: [place]   Lot: [id number]   [place]: [id number]

## 2019-07-11 ENCOUNTER — Encounter: Payer: Self-pay | Admitting: Cardiology

## 2019-07-11 ENCOUNTER — Ambulatory Visit (INDEPENDENT_AMBULATORY_CARE_PROVIDER_SITE_OTHER): Payer: Medicare Other | Admitting: Cardiology

## 2019-07-11 ENCOUNTER — Other Ambulatory Visit: Payer: Self-pay

## 2019-07-11 VITALS — BP 162/92 | HR 82 | Temp 97.8°F | Resp 18 | Ht 70.0 in | Wt 218.8 lb

## 2019-07-11 DIAGNOSIS — Z1329 Encounter for screening for other suspected endocrine disorder: Secondary | ICD-10-CM | POA: Diagnosis not present

## 2019-07-11 DIAGNOSIS — I10 Essential (primary) hypertension: Secondary | ICD-10-CM | POA: Diagnosis not present

## 2019-07-11 DIAGNOSIS — R011 Cardiac murmur, unspecified: Secondary | ICD-10-CM | POA: Insufficient documentation

## 2019-07-11 DIAGNOSIS — E782 Mixed hyperlipidemia: Secondary | ICD-10-CM | POA: Diagnosis not present

## 2019-07-11 NOTE — Patient Instructions (Addendum)
Medication Instructions:  No medication changes *If you need a refill on your cardiac medications before your next appointment, please call your pharmacy*  Lab Work: You had a BMET, CBC, TSH, LFT and Lipids If you have labs (blood work) drawn today and your tests are completely normal, you will receive your results only by: Marland Kitchen MyChart Message (if you have MyChart) OR . A paper copy in the mail If you have any lab test that is abnormal or we need to change your treatment, we will call you to review the results.  Testing/Procedures: Your physician has requested that you have an echocardiogram. Echocardiography is a painless test that uses sound waves to create images of your heart. It provides your doctor with information about the size and shape of your heart and how well your heart's chambers and valves are working. This procedure takes approximately one hour. There are no restrictions for this procedure.    Follow-Up: At [facility], you and your health needs are our priority.  As part of our continuing mission to provide you with exceptional heart care, we have created designated Provider Care Teams.  These Care Teams include your primary Cardiologist (physician) and Advanced Practice Providers (APPs -  Physician Assistants and Nurse Practitioners) who all work together to provide you with the care you need, when you need it.  Your next appointment:   1 month(s)  The format for your next appointment:   In Person  Provider:   Jyl Heinz, MD  Other Instructions  Echocardiogram An echocardiogram is a procedure that uses painless sound waves (ultrasound) to produce an image of the heart. Images from an echocardiogram can provide important information about:  Signs of coronary artery disease (CAD).  Aneurysm detection. An aneurysm is a weak or damaged part of an artery wall that bulges out from the normal force of blood pumping through the body.  Heart size and shape. Changes  in the size or shape of the heart can be associated with certain conditions, including heart failure, aneurysm, and CAD.  Heart muscle function.  Heart valve function.  Signs of a past heart attack.  Fluid buildup around the heart.  Thickening of the heart muscle.  A tumor or infectious growth around the heart valves. Tell a health care provider about:  Any allergies you have.  All medicines you are taking, including vitamins, herbs, eye drops, creams, and over-the-counter medicines.  Any blood disorders you have.  Any surgeries you have had.  Any medical conditions you have.  Whether you are pregnant or may be pregnant. What are the risks? Generally, this is a safe procedure. However, problems may occur, including:  Allergic reaction to dye (contrast) that may be used during the procedure. What happens before the procedure? No specific preparation is needed. You may eat and drink normally. What happens during the procedure?   An IV tube may be inserted into one of your veins.  You may receive contrast through this tube. A contrast is an injection that improves the quality of the pictures from your heart.  A gel will be applied to your chest.  A wand-like tool (transducer) will be moved over your chest. The gel will help to transmit the sound waves from the transducer.  The sound waves will harmlessly bounce off of your heart to allow the heart images to be captured in real-time motion. The images will be recorded on a computer. The procedure may vary among health care providers and hospitals. What happens  after the procedure?  You may return to your normal, everyday life, including diet, activities, and medicines, unless your health care provider tells you not to do that. Summary  An echocardiogram is a procedure that uses painless sound waves (ultrasound) to produce an image of the heart.  Images from an echocardiogram can provide important information about the  size and shape of your heart, heart muscle function, heart valve function, and fluid buildup around your heart.  You do not need to do anything to prepare before this procedure. You may eat and drink normally.  After the echocardiogram is completed, you may return to your normal, everyday life, unless your health care provider tells you not to do that. This information is not intended to replace advice given to you by your health care provider. Make sure you discuss any questions you have with your health care provider. Document Revised: 09/06/2018 Document Reviewed: 06/18/2016 Elsevier Patient Education  [place].

## 2019-07-11 NOTE — Progress Notes (Signed)
Cardiology Office Note:    Date:  07/11/2019   ID:  Daniel Krueger, DOB Aug 12, 1950, MRN [id number]  PCP:  System, Pcp Not In  Cardiologist:  Jenean Lindau, MD   Referring MD: No ref. provider found    ASSESSMENT:    1. Essential hypertension   2. Mixed dyslipidemia   3. Cardiac murmur    PLAN:    In order of problems listed above:  1. Essential hypertension: Primary prevention stressed with the patient.  Importance of compliance with diet and medication stressed and he vocalized understanding.  Diet was discussed.  He has abdominal obesity.  Weight reduction was stressed importance of walking at least half an hour a day at least 5 days a week was emphasized and he promises to do so. 2. Mixed dyslipidemia: Diet was emphasized he will have all blood work done today including Chem-7 liver lipid check and TSH and CBC 3. Cardiac murmur: Echocardiogram will be done to assess this 4. Patient will be seen in follow-up appointment in 1 months or earlier if the patient has any concerns 5. He will keep a track of his blood pressures on a regular basis.  He tells me that his blood pressures are better at home.  He will bring them to me next month.   Medication Adjustments/Labs and Tests Ordered: Current medicines are reviewed at length with the patient today.  Concerns regarding medicines are outlined above.  No orders of the defined types were placed in this encounter.  No orders of the defined types were placed in this encounter.    Chief Complaint  Patient presents with  . Follow-up    FU no chest pain or other complaints. Pt had COVID Vaccine 5 days ago.     History of Present Illness:    Daniel Krueger is a 69 y.o. male.  Patient has past medical history of essential hypertension dyslipidemia and was evaluated by me for chest discomfort.  He did not keep his appointment for stress testing.  Subsequently is done fine.  No chest pain orthopnea or PND.  He walks some on a  regular basis.  At the time of my evaluation, the patient is alert awake oriented and in no distress.  He is not keen on stress testing because of the pandemic and the fact that he is asymptomatic.  Past Medical History:  Diagnosis Date  . Hypertension     Past Surgical History:  Procedure Laterality Date  . CARDIAC CATHETERIZATION     "they found nothing"  . HERNIA REPAIR      Current Medications: Current Meds  Medication Sig  . atorvastatin (LIPITOR) 20 MG tablet Take 20 mg by mouth daily.  Marland Kitchen lisinopril (ZESTRIL) 40 MG tablet Take 40 mg by mouth daily.     Allergies:   Patient has no known allergies.   Social History   Socioeconomic History  . Marital status: Married    Spouse name: Not on file  . Number of children: Not on file  . Years of education: Not on file  . Highest education level: Not on file  Occupational History  . Not on file  Tobacco Use  . Smoking status: Former Smoker    Quit date: 06/06/1983    Years since quitting: 36.1  . Smokeless tobacco: Never Used  Substance and Sexual Activity  . Alcohol use: No  . Drug use: Never  . Sexual activity: Not on file  Other Topics Concern  . Not  on file  Social History Narrative  . Not on file   Social Determinants of Health   Financial Resource Strain:   . Difficulty of Paying Living Expenses: Not on file  Food Insecurity:   . Worried About Charity fundraiser in the Last Year: Not on file  . Ran Out of Food in the Last Year: Not on file  Transportation Needs:   . Lack of Transportation (Medical): Not on file  . Lack of Transportation (Non-Medical): Not on file  Physical Activity:   . Days of Exercise per Week: Not on file  . Minutes of Exercise per Session: Not on file  Stress:   . Feeling of Stress : Not on file  Social Connections:   . Frequency of Communication with Friends and Family: Not on file  . Frequency of Social Gatherings with Friends and Family: Not on file  . Attends Religious  Services: Not on file  . Active Member of Clubs or Organizations: Not on file  . Attends Archivist Meetings: Not on file  . Marital Status: Not on file     Family History: The patient's family history is not on file.  ROS:   Please see the history of present illness.    All other systems reviewed and are negative.  EKGs/Labs/Other Studies Reviewed:    The following studies were reviewed today: I discussed my findings with the patient at length.   Recent Labs: No results found for requested labs within last 8760 hours.  Recent Lipid Panel No results found for: CHOL, TRIG, HDL, CHOLHDL, VLDL, LDLCALC, LDLDIRECT  Physical Exam:    VS:  BP (!) 162/92 (BP Location: Right Arm, Patient Position: Sitting, Cuff Size: Normal)   Pulse 82   Temp 97.8 F (36.6 C)   Resp 18   Ht 5\' 10"  (1.778 m)   Wt 218 lb 12 oz (99.2 kg)   BMI 31.39 kg/m     Wt Readings from Last 3 Encounters:  07/11/19 218 lb 12 oz (99.2 kg)  12/14/18 218 lb (98.9 kg)     GEN: Patient is in no acute distress HEENT: Normal NECK: No JVD; No carotid bruits LYMPHATICS: No lymphadenopathy CARDIAC: Hear sounds regular, 2/6 systolic murmur at the apex. RESPIRATORY:  Clear to auscultation without rales, wheezing or rhonchi  ABDOMEN: Soft, non-tender, non-distended MUSCULOSKELETAL:  No edema; No deformity  SKIN: Warm and dry NEUROLOGIC:  Alert and oriented x 3 PSYCHIATRIC:  Normal affect   Signed, Jenean Lindau, MD  07/11/2019 10:28 AM    [place]

## 2019-07-12 LAB — CBC WITH DIFFERENTIAL/PLATELET
Basophils Absolute: 0 10*3/uL (ref 0.0–0.2)
Basos: 0 %
EOS (ABSOLUTE): 0 10*3/uL (ref 0.0–0.4)
Eos: 1 %
Hematocrit: 45.2 % (ref 37.5–51.0)
Hemoglobin: 15.7 g/dL (ref 13.0–17.7)
Immature Grans (Abs): 0 10*3/uL (ref 0.0–0.1)
Immature Granulocytes: 0 %
Lymphocytes Absolute: 1.2 10*3/uL (ref 0.7–3.1)
Lymphs: 25 %
MCH: 31.8 pg (ref 26.6–33.0)
MCHC: 34.7 g/dL (ref 31.5–35.7)
MCV: 92 fL (ref 79–97)
Monocytes Absolute: 0.5 10*3/uL (ref 0.1–0.9)
Monocytes: 10 %
Neutrophils Absolute: 3.2 10*3/uL (ref 1.4–7.0)
Neutrophils: 64 %
Platelets: 207 10*3/uL (ref 150–450)
RBC: 4.94 x10E6/uL (ref 4.14–5.80)
RDW: 12.3 % (ref 11.6–15.4)
WBC: 4.9 10*3/uL (ref 3.4–10.8)

## 2019-07-12 LAB — BASIC METABOLIC PANEL
BUN/Creatinine Ratio: 15 (ref 10–24)
BUN: 14 mg/dL (ref 8–27)
CO2: 24 mmol/L (ref 20–29)
Calcium: 9.4 mg/dL (ref 8.6–10.2)
Chloride: 99 mmol/L (ref 96–106)
Creatinine, Ser: 0.93 mg/dL (ref 0.76–1.27)
GFR calc Af Amer: 97 mL/min/{1.73_m2} (ref >59)
GFR calc non Af Amer: 84 mL/min/{1.73_m2} (ref >59)
Glucose: 96 mg/dL (ref 65–99)
Potassium: 4.3 mmol/L (ref 3.5–5.2)
Sodium: 138 mmol/L (ref 134–144)

## 2019-07-12 LAB — LIPID PANEL
Chol/HDL Ratio: 2.9 ratio (ref 0.0–5.0)
Cholesterol, Total: 145 mg/dL (ref 100–199)
HDL: 50 mg/dL (ref >39)
LDL Chol Calc (NIH): 76 mg/dL (ref 0–99)
Triglycerides: 101 mg/dL (ref 0–149)
VLDL Cholesterol Cal: 19 mg/dL (ref 5–40)

## 2019-07-12 LAB — HEPATIC FUNCTION PANEL
ALT: 20 IU/L (ref 0–44)
AST: 15 IU/L (ref 0–40)
Albumin: 4.9 g/dL — ABNORMAL HIGH (ref 3.8–4.8)
Alkaline Phosphatase: 83 IU/L (ref 39–117)
Bilirubin Total: 0.8 mg/dL (ref 0.0–1.2)
Bilirubin, Direct: 0.22 mg/dL (ref 0.00–0.40)
Total Protein: 7.2 g/dL (ref 6.0–8.5)

## 2019-07-12 LAB — TSH: TSH: 2.31 u[IU]/mL (ref 0.450–4.500)

## 2019-07-15 ENCOUNTER — Other Ambulatory Visit: Payer: Self-pay

## 2019-07-15 ENCOUNTER — Ambulatory Visit (HOSPITAL_BASED_OUTPATIENT_CLINIC_OR_DEPARTMENT_OTHER)
Admission: RE | Admit: 2019-07-15 | Discharge: 2019-07-15 | Disposition: A | Discharge status: Home / Self Care | Payer: Medicare Other | Source: Ambulatory Visit | Attending: Cardiology | Admitting: Cardiology

## 2019-07-15 DIAGNOSIS — E782 Mixed hyperlipidemia: Secondary | ICD-10-CM | POA: Diagnosis not present

## 2019-07-15 DIAGNOSIS — I10 Essential (primary) hypertension: Secondary | ICD-10-CM | POA: Diagnosis not present

## 2019-07-15 DIAGNOSIS — R011 Cardiac murmur, unspecified: Secondary | ICD-10-CM

## 2019-07-15 NOTE — Progress Notes (Signed)
  Echocardiogram 2D Echocardiogram has been performed.  Cardell Peach 07/15/2019, 8:44 AM

## 2019-07-16 ENCOUNTER — Telehealth: Payer: Self-pay

## 2019-07-16 ENCOUNTER — Other Ambulatory Visit: Payer: Self-pay

## 2019-07-16 DIAGNOSIS — I77819 Aortic ectasia, unspecified site: Secondary | ICD-10-CM

## 2019-07-17 NOTE — Telephone Encounter (Signed)
Results reviewed with pt as per Dr. Julien Nordmann note. Pt verbalized understanding and had no additional questions. CT scheduled as recommended by Dr. Geraldo Pitter.

## 2019-07-22 DIAGNOSIS — L821 Other seborrheic keratosis: Secondary | ICD-10-CM | POA: Diagnosis not present

## 2019-07-22 DIAGNOSIS — D229 Melanocytic nevi, unspecified: Secondary | ICD-10-CM | POA: Diagnosis not present

## 2019-07-31 ENCOUNTER — Ambulatory Visit: Payer: Medicare Other | Attending: Internal Medicine

## 2019-07-31 DIAGNOSIS — Z23 Encounter for immunization: Secondary | ICD-10-CM

## 2019-07-31 NOTE — Progress Notes (Signed)
   Covid-19 Vaccination Clinic  Name:  Daniel Krueger    MRN: [id number] DOB: [birth year]  07/31/2019  [name] was observed post Covid-19 immunization for 15 minutes without incident. He was provided with Vaccine Information Sheet and instruction to access the V-Safe system.   [name] was instructed to call 911 with any severe reactions post vaccine: Marland Kitchen Difficulty breathing  . Swelling of face and throat  . A fast heartbeat  . A bad rash all over body  . Dizziness and weakness   Immunizations Administered    Name Date Dose VIS Date Route   Pfizer COVID-19 Vaccine 07/31/2019 12:28 PM 0.3 mL 05/10/2019 Intramuscular   Manufacturer: [place]   Lot: [id number]   [place]: [id number]

## 2019-08-08 ENCOUNTER — Ambulatory Visit: Payer: Medicare Other | Admitting: Cardiology

## 2019-08-15 ENCOUNTER — Other Ambulatory Visit: Payer: Self-pay

## 2019-08-15 ENCOUNTER — Ambulatory Visit (HOSPITAL_BASED_OUTPATIENT_CLINIC_OR_DEPARTMENT_OTHER)
Admission: RE | Admit: 2019-08-15 | Discharge: 2019-08-15 | Disposition: A | Discharge status: Home / Self Care | Payer: Medicare Other | Source: Ambulatory Visit | Attending: Cardiology | Admitting: Cardiology

## 2019-08-15 ENCOUNTER — Encounter (HOSPITAL_BASED_OUTPATIENT_CLINIC_OR_DEPARTMENT_OTHER): Payer: Self-pay

## 2019-08-15 DIAGNOSIS — I77819 Aortic ectasia, unspecified site: Secondary | ICD-10-CM | POA: Insufficient documentation

## 2019-08-15 MED ORDER — IOHEXOL 350 MG/ML SOLN
100.0000 mL | Freq: Once | INTRAVENOUS | Status: AC | PRN
  Administered 2019-08-15: 11:00:00 100 mL via INTRAVENOUS

## 2019-08-16 ENCOUNTER — Ambulatory Visit: Payer: Medicare Other | Admitting: Cardiology

## 2019-09-02 DIAGNOSIS — I77810 Thoracic aortic ectasia: Secondary | ICD-10-CM | POA: Diagnosis not present

## 2019-09-02 DIAGNOSIS — I10 Essential (primary) hypertension: Secondary | ICD-10-CM | POA: Diagnosis not present

## 2019-09-02 DIAGNOSIS — E7800 Pure hypercholesterolemia, unspecified: Secondary | ICD-10-CM | POA: Diagnosis not present

## 2019-09-02 DIAGNOSIS — Z0000 Encounter for general adult medical examination without abnormal findings: Secondary | ICD-10-CM | POA: Diagnosis not present

## 2019-09-03 ENCOUNTER — Other Ambulatory Visit: Payer: Self-pay

## 2019-09-03 ENCOUNTER — Encounter: Payer: Self-pay | Admitting: Cardiology

## 2019-09-03 ENCOUNTER — Ambulatory Visit (INDEPENDENT_AMBULATORY_CARE_PROVIDER_SITE_OTHER): Payer: Medicare Other | Admitting: Cardiology

## 2019-09-03 VITALS — BP 148/90 | HR 84 | Ht 70.0 in | Wt 206.0 lb

## 2019-09-03 DIAGNOSIS — E782 Mixed hyperlipidemia: Secondary | ICD-10-CM | POA: Diagnosis not present

## 2019-09-03 DIAGNOSIS — I10 Essential (primary) hypertension: Secondary | ICD-10-CM

## 2019-09-03 DIAGNOSIS — I2510 Atherosclerotic heart disease of native coronary artery without angina pectoris: Secondary | ICD-10-CM | POA: Diagnosis not present

## 2019-09-03 NOTE — Patient Instructions (Signed)
Medication Instructions:  Your physician has recommended you make the following change in your medication:   Take 81 mg coated aspirin daily.  *If you need a refill on your cardiac medications before your next appointment, please call your pharmacy*   Lab Work: None ordered If you have labs (blood work) drawn today and your tests are completely normal, you will receive your results only by: Marland Kitchen MyChart Message (if you have MyChart) OR . A paper copy in the mail If you have any lab test that is abnormal or we need to change your treatment, we will call you to review the results.   Testing/Procedures: None ordered   Follow-Up: At [facility], you and your health needs are our priority.  As part of our continuing mission to provide you with exceptional heart care, we have created designated Provider Care Teams.  These Care Teams include your primary Cardiologist (physician) and Advanced Practice Providers (APPs -  Physician Assistants and Nurse Practitioners) who all work together to provide you with the care you need, when you need it.  We recommend signing up for the patient portal called "MyChart".  Sign up information is provided on this After Visit Summary.  MyChart is used to connect with patients for Virtual Visits (Telemedicine).  Patients are able to view lab/test results, encounter notes, upcoming appointments, etc.  Non-urgent messages can be sent to your provider as well.   To learn more about what you can do with MyChart, go to [email or web address].    Your next appointment:   6 month(s)  The format for your next appointment:   In Person  Provider:   Jyl Heinz, MD   Other Instructions NA

## 2019-09-03 NOTE — Progress Notes (Signed)
Cardiology Office Note:    Date:  09/03/2019   ID:  Daniel Krueger, DOB 1950-12-17, MRN [id number]  PCP:  Marda Stalker, PA-C  Cardiologist:  Jenean Lindau, MD   Referring MD: No ref. provider found    ASSESSMENT:    1. Essential hypertension   2. Coronary artery calcification seen on CT scan   3. Mixed dyslipidemia    PLAN:    In order of problems listed above:  1. Coronary artery disease: Secondary prevention stressed with the patient.  Importance of compliance with diet and medication stressed.  Findings of the CT scan were discussed with him at length and he understood. 2. Essential hypertension: Blood pressure stable.  He mentioned his blood pressure readings at home and they are completely fine. 3. Mixed dyslipidemia: Lipids were reviewed he just had them yesterday and I reviewed them from his cell phone.  He will continue current medications 4. Patient will be seen in follow-up appointment in 6 months or earlier if the patient has any concerns    Medication Adjustments/Labs and Tests Ordered: Current medicines are reviewed at length with the patient today.  Concerns regarding medicines are outlined above.  No orders of the defined types were placed in this encounter.  No orders of the defined types were placed in this encounter.    Chief Complaint  Patient presents with  . Follow-up    1 Month     History of Present Illness:    Daniel Krueger is a 69 y.o. male.  Patient has past medical history of essential hypertension and dyslipidemia.  His CT scan is revealed extensive calcifications and a very elevated coronary score for calcium.  Patient denies any problems at this time and takes care of activities of daily living.  No chest pain orthopnea or PND.  He exercises regularly.  He tells me that he walks about 2 and 1/2 miles in a day.  At the time of my evaluation, the patient is alert awake oriented and in no distress. Past Medical History:  Diagnosis  Date  . Hypertension     Past Surgical History:  Procedure Laterality Date  . CARDIAC CATHETERIZATION     "they found nothing"  . HERNIA REPAIR      Current Medications: Current Meds  Medication Sig  . atorvastatin (LIPITOR) 20 MG tablet Take 20 mg by mouth daily.  . hydrochlorothiazide (HYDRODIURIL) 12.5 MG tablet Take 12.5 mg by mouth every morning.  Marland Kitchen lisinopril (ZESTRIL) 40 MG tablet Take 40 mg by mouth daily.     Allergies:   Patient has no known allergies.   Social History   Socioeconomic History  . Marital status: Married    Spouse name: Not on file  . Number of children: Not on file  . Years of education: Not on file  . Highest education level: Not on file  Occupational History  . Not on file  Tobacco Use  . Smoking status: Former Smoker    Quit date: 06/06/1983    Years since quitting: 36.2  . Smokeless tobacco: Never Used  Substance and Sexual Activity  . Alcohol use: No  . Drug use: Never  . Sexual activity: Not on file  Other Topics Concern  . Not on file  Social History Narrative  . Not on file   Social Determinants of Health   Financial Resource Strain:   . Difficulty of Paying Living Expenses:   Food Insecurity:   . Worried About Estate manager/land agent  of Food in the Last Year:   . [place] in the Last Year:   Transportation Needs:   . Lack of Transportation (Medical):   Marland Kitchen Lack of Transportation (Non-Medical):   Physical Activity:   . Days of Exercise per Week:   . Minutes of Exercise per Session:   Stress:   . Feeling of Stress :   Social Connections:   . Frequency of Communication with Friends and Family:   . Frequency of Social Gatherings with Friends and Family:   . Attends Religious Services:   . Active Member of Clubs or Organizations:   . Attends Archivist Meetings:   Marland Kitchen Marital Status:      Family History: The patient's family history is not on file.  ROS:   Please see the history of present illness.    All other  systems reviewed and are negative.  EKGs/Labs/Other Studies Reviewed:    The following studies were reviewed today: IMPRESSIONS    1. Left ventricular ejection fraction, by estimation, is 60 to 65%. The  left ventricle has normal function. LV endocardial border not optimally  defined to evaluate regional wall motion.  2. There is moderate dilatation of the ascending aorta measuring 41 mm.   IMPRESSION: 1. 3.1 cm borderline dilatation of aortic arch. Recommend annual imaging followup by CTA or MRA. This recommendation follows 2010 ACCF/AHA/AATS/ACR/ASA/SCA/SCAI/SIR/STS/SVM Guidelines for the Diagnosis and Management of Patients with Thoracic Aortic Disease. Circulation.2010; 121: [id number] 2. Small hiatal hernia. 3. Old granulomatous disease. 4. Coronary and Aortic Atherosclerosis (ICD10-170.0).   Recent Labs: 07/11/2019: ALT 20; BUN 14; Creatinine, Ser 0.93; Hemoglobin 15.7; Platelets 207; Potassium 4.3; Sodium 138; TSH 2.310  Recent Lipid Panel    Component Value Date/Time   CHOL 145 07/11/2019 1045   TRIG 101 07/11/2019 1045   HDL 50 07/11/2019 1045   CHOLHDL 2.9 07/11/2019 1045   LDLCALC 76 07/11/2019 1045    Physical Exam:    VS:  BP (!) 148/90   Pulse 84   Ht 5\' 10"  (1.778 m)   Wt 206 lb (93.4 kg)   SpO2 97%   BMI 29.56 kg/m     Wt Readings from Last 3 Encounters:  09/03/19 206 lb (93.4 kg)  07/11/19 218 lb 12 oz (99.2 kg)  12/14/18 218 lb (98.9 kg)     GEN: Patient is in no acute distress HEENT: Normal NECK: No JVD; No carotid bruits LYMPHATICS: No lymphadenopathy CARDIAC: Hear sounds regular, 2/6 systolic murmur at the apex. RESPIRATORY:  Clear to auscultation without rales, wheezing or rhonchi  ABDOMEN: Soft, non-tender, non-distended MUSCULOSKELETAL:  No edema; No deformity  SKIN: Warm and dry NEUROLOGIC:  Alert and oriented x 3 PSYCHIATRIC:  Normal affect   Signed, Jenean Lindau, MD  09/03/2019 9:56 AM    [place]

## 2019-10-07 DIAGNOSIS — R109 Unspecified abdominal pain: Secondary | ICD-10-CM | POA: Diagnosis not present

## 2019-10-08 ENCOUNTER — Other Ambulatory Visit: Payer: Self-pay | Admitting: Family Medicine

## 2019-10-08 DIAGNOSIS — R109 Unspecified abdominal pain: Secondary | ICD-10-CM

## 2020-03-20 DIAGNOSIS — N390 Urinary tract infection, site not specified: Secondary | ICD-10-CM | POA: Diagnosis not present

## 2020-03-20 IMAGING — CT CT ANGIO CHEST
2 of 6 series · 18 of 46 positions shown · IV contrast (Omnipaque)
Comparison: None available

CLINICAL DATA: Dilated aorta, follow-up ultrasound

EXAM:
CT ANGIOGRAPHY CHEST WITH CONTRAST
TECHNIQUE: Multidetector CT imaging of the chest was performed using the
standard protocol during bolus administration of intravenous
contrast. Multiplanar CT image reconstructions and MIPs were
obtained to evaluate the vascular anatomy.
CONTRAST:  100mL OMNIPAQUE IOHEXOL 350 MG/ML SOLN

[Series 4: axial arterial · axial · arterial · 0.76mm/px · z∈[-297,-33]mm · 15 of 100 slices shown]
[im 6/100  lung]
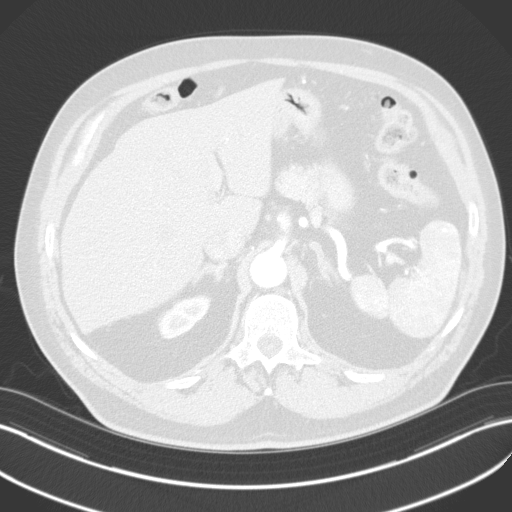
[im 11/100  soft-tissue]
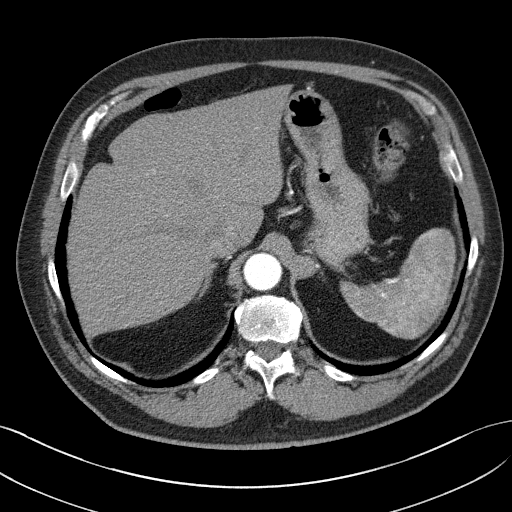
[im 21/100  lung]
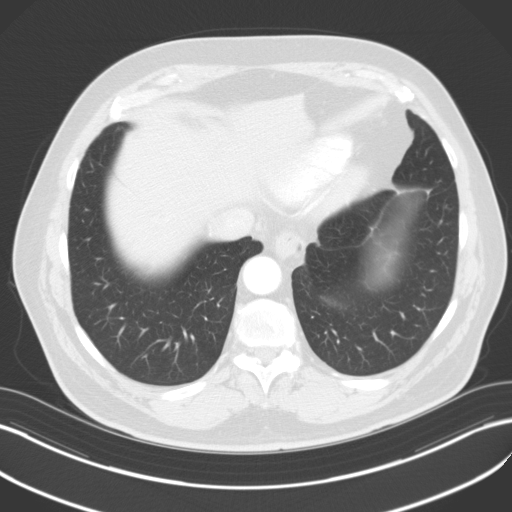
[im 27/100  soft-tissue]
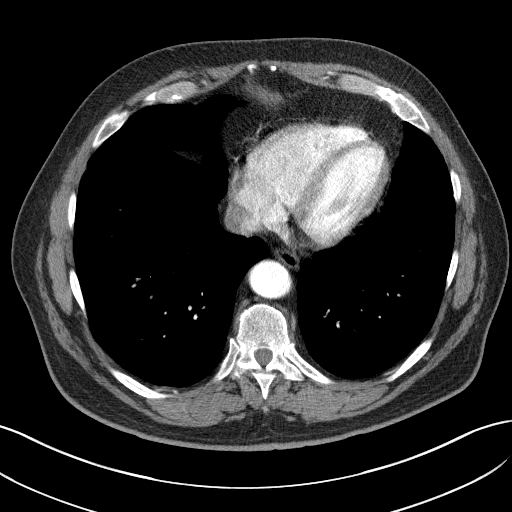
[im 32/100  lung]
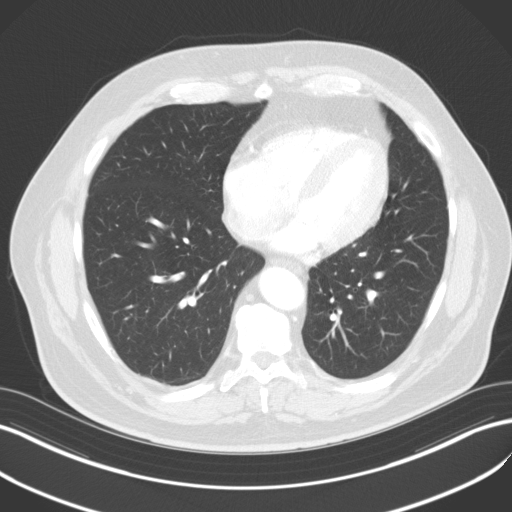
[im 37/100  soft-tissue]
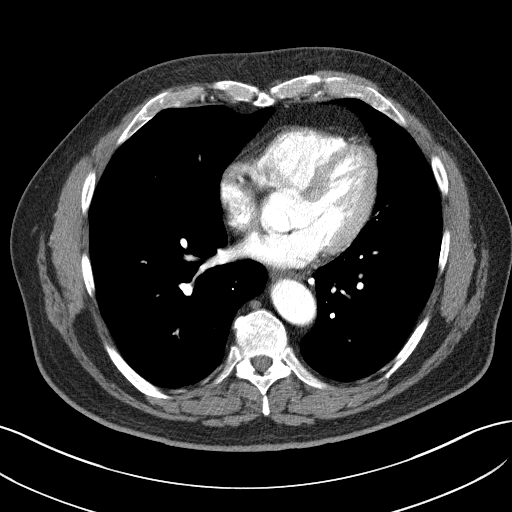
[im 42/100  lung]
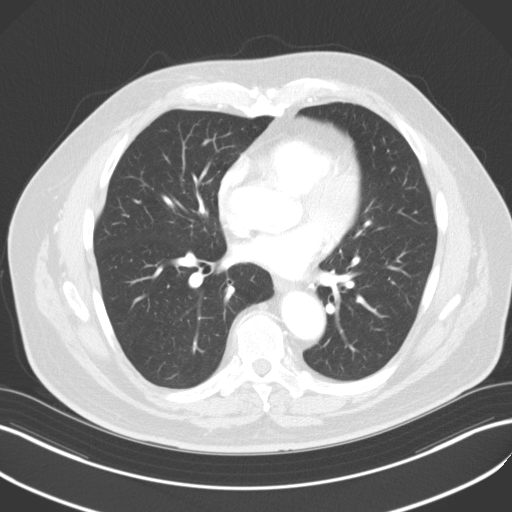
[im 53/100  soft-tissue]
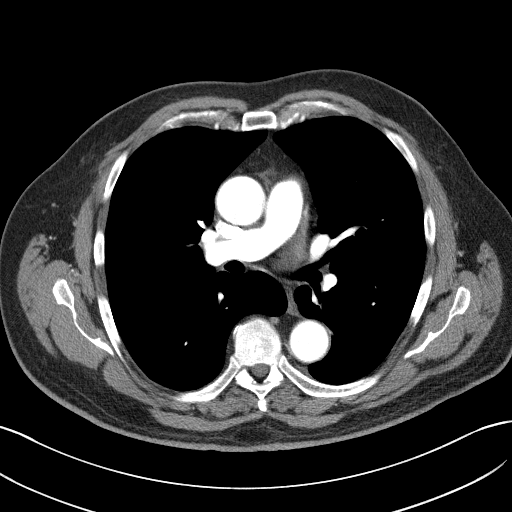
[im 58/100  lung]
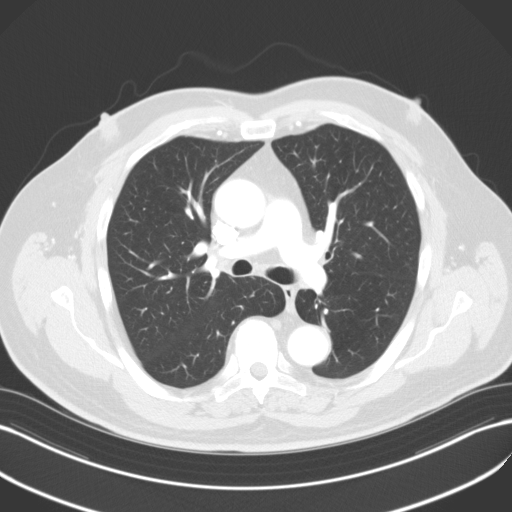
[im 63/100  soft-tissue]
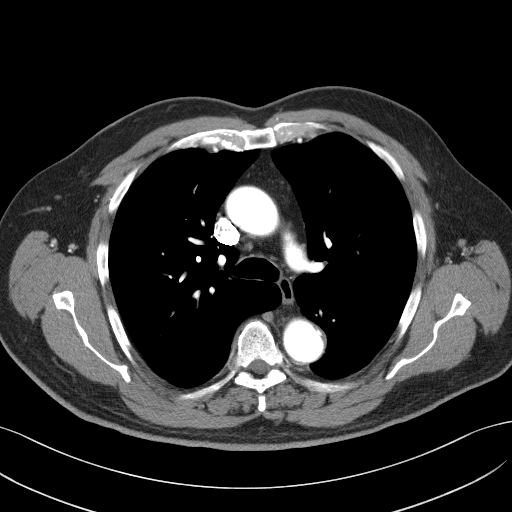
[im 68/100  lung]
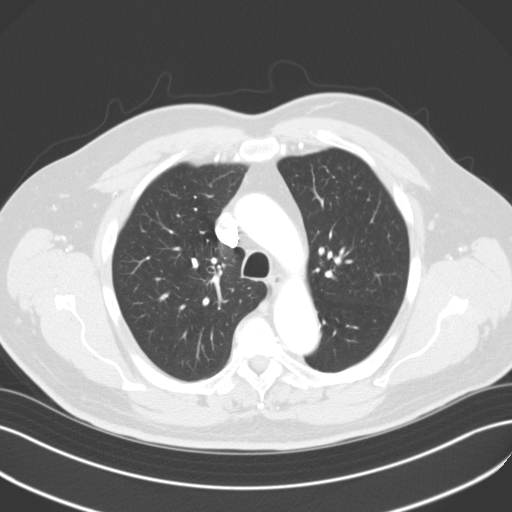
[im 73/100  soft-tissue]
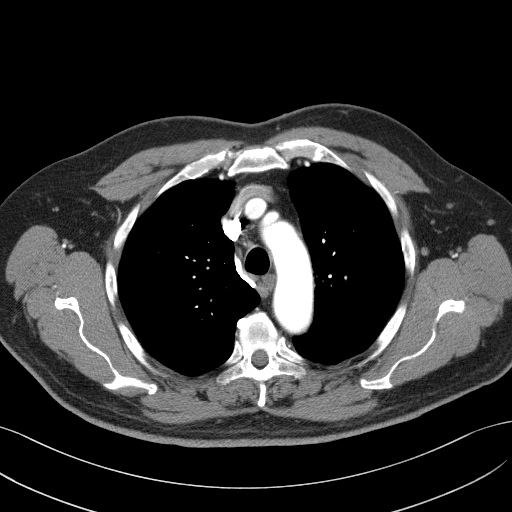
[im 84/100  lung]
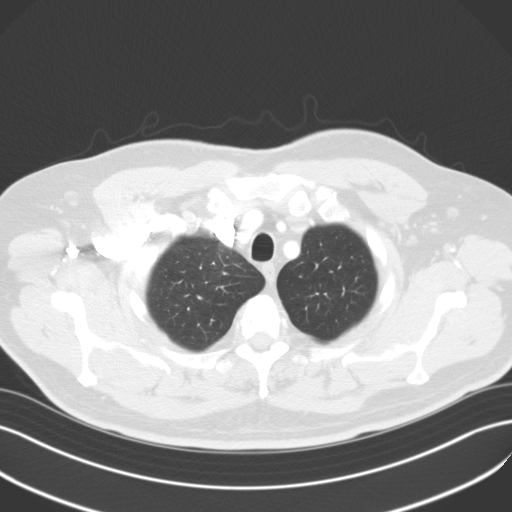
[im 89/100  soft-tissue]
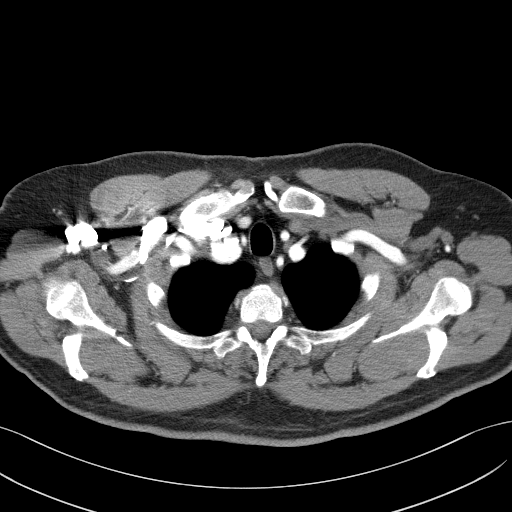
[im 94/100  lung]
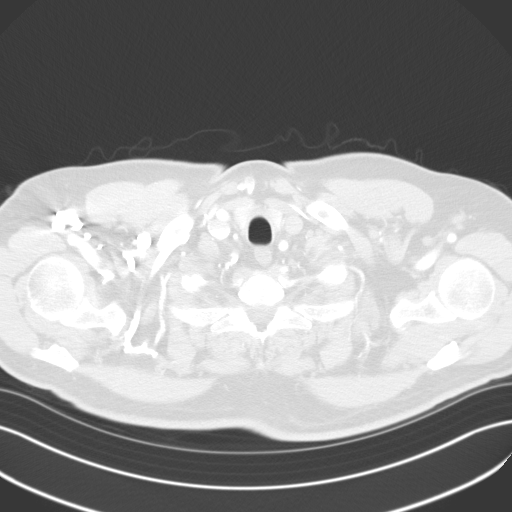

[Series 7: coronal · coronal · 0.64mm/px · 3 of 101 slices shown]
[im 26/101  soft-tissue]
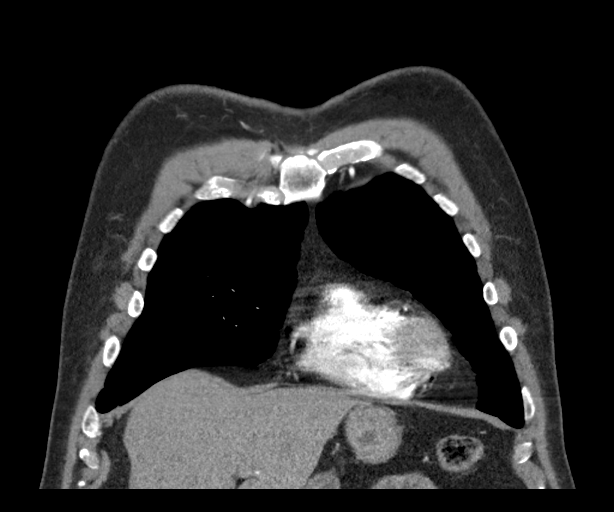
[im 51/101  soft-tissue]
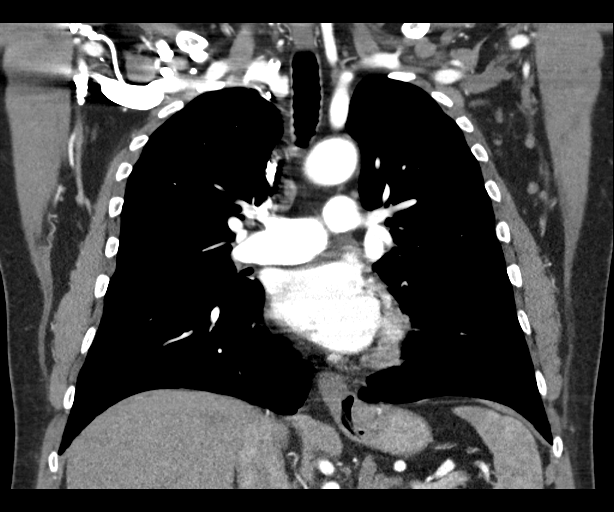
[im 76/101  soft-tissue]
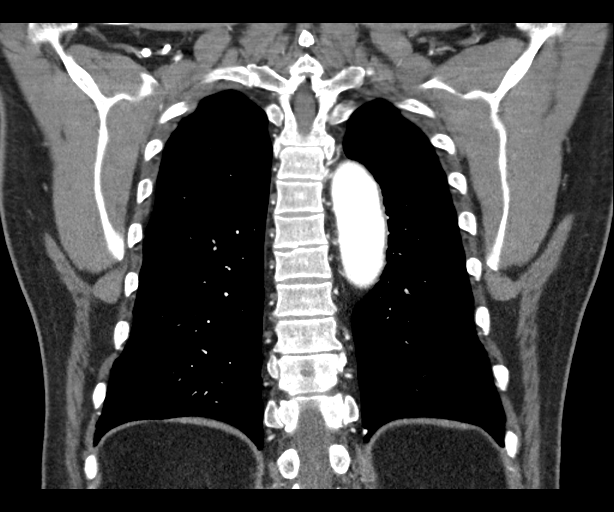

[18 of 46 positions shown; findings below may reference images not displayed]

FINDINGS: Cardiovascular: Heart size normal. No pericardial effusion.
Satisfactory opacification of pulmonary arteries noted, and there is
no evidence of pulmonary emboli.

Aortic Root:

--Valve: 2.5 cm

--Sinuses: 4.1 cm

--Sinotubular Junction: 2.9 cm

Limitations by motion: Mild

Thoracic Aorta:

--Ascending Aorta: 3.7 cm

--Aortic Arch: 3.1 cm

--Descending Aorta: 3.1 cm

Mediastinum/Nodes: Small hiatal hernia. No hilar or mediastinal
adenopathy. Calcified left paraesophageal nodes.

Lungs/Pleura: No pleural effusion. No pneumothorax. Calcified
granuloma, medial basal segment left lower lobe. Lungs otherwise
clear.

Upper Abdomen: Scattered splenic calcified granulomas. No acute
findings.

Musculoskeletal: No acute fracture or worrisome bone lesion.

Review of the MIP images confirms the above findings.
IMPRESSION: 1. 3.1 cm borderline dilatation of aortic arch. Recommend annual
imaging followup by CTA or MRA. This recommendation follows 8545
ACCF/AHA/AATS/ACR/ASA/SCA/DUSAG NALOG/AJAHUANA/RAMEION/NINI MU Guidelines for the
Diagnosis and Management of Patients with Thoracic Aortic Disease.
Circulation.8545; 121: [id number]
2. Small hiatal hernia.
3. Old granulomatous disease.
4. Coronary and Aortic Atherosclerosis ([id number]-170.0).

## 2020-07-14 DIAGNOSIS — H401121 Primary open-angle glaucoma, left eye, mild stage: Secondary | ICD-10-CM | POA: Diagnosis not present

## 2020-07-14 DIAGNOSIS — H401112 Primary open-angle glaucoma, right eye, moderate stage: Secondary | ICD-10-CM | POA: Diagnosis not present

## 2020-08-20 DIAGNOSIS — H401134 Primary open-angle glaucoma, bilateral, indeterminate stage: Secondary | ICD-10-CM | POA: Diagnosis not present

## 2020-09-10 ENCOUNTER — Other Ambulatory Visit: Payer: Self-pay | Admitting: Family Medicine

## 2020-09-10 DIAGNOSIS — Z0000 Encounter for general adult medical examination without abnormal findings: Secondary | ICD-10-CM | POA: Diagnosis not present

## 2020-09-10 DIAGNOSIS — I77810 Thoracic aortic ectasia: Secondary | ICD-10-CM

## 2020-09-10 DIAGNOSIS — Z125 Encounter for screening for malignant neoplasm of prostate: Secondary | ICD-10-CM | POA: Diagnosis not present

## 2020-09-10 DIAGNOSIS — E7800 Pure hypercholesterolemia, unspecified: Secondary | ICD-10-CM | POA: Diagnosis not present

## 2020-09-10 DIAGNOSIS — I700 Atherosclerosis of aorta: Secondary | ICD-10-CM | POA: Diagnosis not present

## 2020-09-10 DIAGNOSIS — I10 Essential (primary) hypertension: Secondary | ICD-10-CM | POA: Diagnosis not present

## 2020-09-24 ENCOUNTER — Inpatient Hospital Stay: Admission: RE | Admit: 2020-09-24 | Payer: Medicare Other | Source: Ambulatory Visit

## 2020-10-08 DIAGNOSIS — H401134 Primary open-angle glaucoma, bilateral, indeterminate stage: Secondary | ICD-10-CM | POA: Diagnosis not present

## 2020-10-28 ENCOUNTER — Other Ambulatory Visit: Payer: Medicare Other

## 2020-10-28 DIAGNOSIS — N400 Enlarged prostate without lower urinary tract symptoms: Secondary | ICD-10-CM | POA: Diagnosis not present

## 2020-10-28 DIAGNOSIS — R972 Elevated prostate specific antigen [PSA]: Secondary | ICD-10-CM | POA: Diagnosis not present

## 2020-10-28 DIAGNOSIS — N5201 Erectile dysfunction due to arterial insufficiency: Secondary | ICD-10-CM | POA: Diagnosis not present

## 2021-02-04 DIAGNOSIS — R972 Elevated prostate specific antigen [PSA]: Secondary | ICD-10-CM | POA: Diagnosis not present

## 2021-02-05 DIAGNOSIS — H2513 Age-related nuclear cataract, bilateral: Secondary | ICD-10-CM | POA: Diagnosis not present

## 2021-02-05 DIAGNOSIS — H04123 Dry eye syndrome of bilateral lacrimal glands: Secondary | ICD-10-CM | POA: Diagnosis not present

## 2021-02-05 DIAGNOSIS — H401131 Primary open-angle glaucoma, bilateral, mild stage: Secondary | ICD-10-CM | POA: Diagnosis not present

## 2021-02-08 DIAGNOSIS — R972 Elevated prostate specific antigen [PSA]: Secondary | ICD-10-CM | POA: Diagnosis not present

## 2021-02-08 DIAGNOSIS — N400 Enlarged prostate without lower urinary tract symptoms: Secondary | ICD-10-CM | POA: Diagnosis not present

## 2021-03-18 DIAGNOSIS — Z23 Encounter for immunization: Secondary | ICD-10-CM | POA: Diagnosis not present

## 2021-04-07 DIAGNOSIS — R1011 Right upper quadrant pain: Secondary | ICD-10-CM | POA: Diagnosis not present

## 2021-06-08 DIAGNOSIS — H25813 Combined forms of age-related cataract, bilateral: Secondary | ICD-10-CM | POA: Diagnosis not present

## 2021-06-08 DIAGNOSIS — H401131 Primary open-angle glaucoma, bilateral, mild stage: Secondary | ICD-10-CM | POA: Diagnosis not present

## 2021-06-08 DIAGNOSIS — H04123 Dry eye syndrome of bilateral lacrimal glands: Secondary | ICD-10-CM | POA: Diagnosis not present

## 2021-07-19 DIAGNOSIS — D492 Neoplasm of unspecified behavior of bone, soft tissue, and skin: Secondary | ICD-10-CM | POA: Diagnosis not present

## 2021-07-19 DIAGNOSIS — H04123 Dry eye syndrome of bilateral lacrimal glands: Secondary | ICD-10-CM | POA: Diagnosis not present

## 2021-08-19 DIAGNOSIS — H401131 Primary open-angle glaucoma, bilateral, mild stage: Secondary | ICD-10-CM | POA: Diagnosis not present

## 2021-08-19 DIAGNOSIS — D492 Neoplasm of unspecified behavior of bone, soft tissue, and skin: Secondary | ICD-10-CM | POA: Diagnosis not present

## 2021-09-06 DIAGNOSIS — H04123 Dry eye syndrome of bilateral lacrimal glands: Secondary | ICD-10-CM | POA: Diagnosis not present

## 2021-09-06 DIAGNOSIS — H401131 Primary open-angle glaucoma, bilateral, mild stage: Secondary | ICD-10-CM | POA: Diagnosis not present

## 2021-09-24 DIAGNOSIS — E7800 Pure hypercholesterolemia, unspecified: Secondary | ICD-10-CM | POA: Diagnosis not present

## 2021-09-24 DIAGNOSIS — J302 Other seasonal allergic rhinitis: Secondary | ICD-10-CM | POA: Diagnosis not present

## 2021-09-24 DIAGNOSIS — Z0000 Encounter for general adult medical examination without abnormal findings: Secondary | ICD-10-CM | POA: Diagnosis not present

## 2021-09-24 DIAGNOSIS — H35033 Hypertensive retinopathy, bilateral: Secondary | ICD-10-CM | POA: Diagnosis not present

## 2021-09-24 DIAGNOSIS — I700 Atherosclerosis of aorta: Secondary | ICD-10-CM | POA: Diagnosis not present

## 2021-09-24 DIAGNOSIS — I77810 Thoracic aortic ectasia: Secondary | ICD-10-CM | POA: Diagnosis not present

## 2021-09-24 DIAGNOSIS — H401131 Primary open-angle glaucoma, bilateral, mild stage: Secondary | ICD-10-CM | POA: Diagnosis not present

## 2021-09-24 DIAGNOSIS — E669 Obesity, unspecified: Secondary | ICD-10-CM | POA: Diagnosis not present

## 2021-09-24 DIAGNOSIS — I10 Essential (primary) hypertension: Secondary | ICD-10-CM | POA: Diagnosis not present

## 2021-09-27 DIAGNOSIS — R220 Localized swelling, mass and lump, head: Secondary | ICD-10-CM | POA: Diagnosis not present

## 2021-10-11 DIAGNOSIS — R059 Cough, unspecified: Secondary | ICD-10-CM | POA: Diagnosis not present

## 2021-10-11 DIAGNOSIS — J302 Other seasonal allergic rhinitis: Secondary | ICD-10-CM | POA: Diagnosis not present

## 2021-10-18 DIAGNOSIS — H401131 Primary open-angle glaucoma, bilateral, mild stage: Secondary | ICD-10-CM | POA: Diagnosis not present

## 2021-10-18 DIAGNOSIS — H04123 Dry eye syndrome of bilateral lacrimal glands: Secondary | ICD-10-CM | POA: Diagnosis not present

## 2021-11-22 DIAGNOSIS — H401131 Primary open-angle glaucoma, bilateral, mild stage: Secondary | ICD-10-CM | POA: Diagnosis not present

## 2021-11-22 DIAGNOSIS — H04123 Dry eye syndrome of bilateral lacrimal glands: Secondary | ICD-10-CM | POA: Diagnosis not present

## 2021-12-20 DIAGNOSIS — H04123 Dry eye syndrome of bilateral lacrimal glands: Secondary | ICD-10-CM | POA: Diagnosis not present

## 2021-12-20 DIAGNOSIS — H401131 Primary open-angle glaucoma, bilateral, mild stage: Secondary | ICD-10-CM | POA: Diagnosis not present

## 2021-12-27 DIAGNOSIS — H401121 Primary open-angle glaucoma, left eye, mild stage: Secondary | ICD-10-CM | POA: Diagnosis not present

## 2022-01-25 DIAGNOSIS — H401111 Primary open-angle glaucoma, right eye, mild stage: Secondary | ICD-10-CM | POA: Diagnosis not present

## 2022-02-18 DIAGNOSIS — H401131 Primary open-angle glaucoma, bilateral, mild stage: Secondary | ICD-10-CM | POA: Diagnosis not present

## 2022-12-09 ENCOUNTER — Other Ambulatory Visit: Payer: Self-pay | Admitting: Gastroenterology

## 2022-12-09 ENCOUNTER — Ambulatory Visit
Admission: RE | Admit: 2022-12-09 | Discharge: 2022-12-09 | Disposition: A | Discharge status: Home / Self Care | Payer: Medicare Other | Source: Ambulatory Visit | Attending: Gastroenterology | Admitting: Gastroenterology

## 2022-12-09 DIAGNOSIS — R1084 Generalized abdominal pain: Secondary | ICD-10-CM

## 2022-12-15 ENCOUNTER — Other Ambulatory Visit: Payer: Self-pay | Admitting: Family Medicine

## 2022-12-15 DIAGNOSIS — R109 Unspecified abdominal pain: Secondary | ICD-10-CM

## 2022-12-21 ENCOUNTER — Ambulatory Visit
Admission: RE | Admit: 2022-12-21 | Discharge: 2022-12-21 | Disposition: A | Discharge status: Home / Self Care | Payer: No Typology Code available for payment source | Source: Ambulatory Visit | Attending: Family Medicine | Admitting: Family Medicine

## 2022-12-21 DIAGNOSIS — R109 Unspecified abdominal pain: Secondary | ICD-10-CM

## 2023-03-17 ENCOUNTER — Ambulatory Visit: Payer: Self-pay | Admitting: General Surgery

## 2023-03-21 NOTE — Patient Instructions (Signed)
SURGICAL WAITING ROOM VISITATION Patients having surgery or a procedure may have no more than 2 support people in the waiting area - these visitors may rotate.    Children under the age of 110 must have an adult with them who is not the patient.  If the patient needs to stay at the hospital during part of their recovery, the visitor guidelines for inpatient rooms apply. Pre-op nurse will coordinate an appropriate time for 1 support person to accompany patient in pre-op.  This support person may not rotate.    Please refer to the [facility] website for the visitor guidelines for Inpatients (after your surgery is over and you are in a regular room).       Your procedure is scheduled on: 03-31-23   Report to [facility] Main Entrance    Report to admitting at 7:15 AM   Call this number if you have problems the morning of surgery [phone]   Do not eat food or drink liquids :After Midnight.           If you have questions, please contact your surgeon's office.   FOLLOW  ANY ADDITIONAL PRE OP INSTRUCTIONS YOU RECEIVED FROM YOUR SURGEON'S OFFICE!!!     Oral Hygiene is also important to reduce your risk of infection.                                    Remember - BRUSH YOUR TEETH THE MORNING OF SURGERY WITH YOUR REGULAR TOOTHPASTE   Do NOT smoke after Midnight   Take these medicines the morning of surgery with A SIP OF WATER:   Atrovastatin  Flonase nasal spray  Eyedrops if needed  Stop all vitamins and herbal supplements 7 days before surgery  Bring CPAP mask and tubing day of surgery.                              You may not have any metal on your body including jewelry, and body piercing             Do not wear  lotions, powders, cologne, or deodorant              Men may shave face and neck.   Do not bring valuables to the hospital. [facility] IS NOT RESPONSIBLE   FOR VALUABLES.   Contacts, dentures or bridgework may not be worn into surgery.  DO NOT BRING  YOUR HOME MEDICATIONS TO THE HOSPITAL. PHARMACY WILL DISPENSE MEDICATIONS LISTED ON YOUR MEDICATION LIST TO YOU DURING YOUR ADMISSION IN THE HOSPITAL!    Patients discharged on the day of surgery will not be allowed to drive home.  Someone NEEDS to stay with you for the first 24 hours after anesthesia.   Special Instructions: Bring a copy of your healthcare power of attorney and living will documents the day of surgery if you haven't scanned them before.              Please read over the following fact sheets you were given: IF YOU HAVE QUESTIONS ABOUT YOUR PRE-OP INSTRUCTIONS PLEASE CALL [phone] G[facility]  If you received a COVID test during your pre-op visit  it is requested that you wear a mask when out in public, stay away from anyone that may not be feeling well and notify your surgeon if you develop symptoms. If  you test positive for Covid or have been in contact with anyone that has tested positive in the last 10 days please notify you surgeon.  [facility] - Preparing for Surgery Before surgery, you can play an important role.  Because skin is not sterile, your skin needs to be as free of germs as possible.  You can reduce the number of germs on your skin by washing with CHG (chlorahexidine gluconate) soap before surgery.  CHG is an antiseptic cleaner which kills germs and bonds with the skin to continue killing germs even after washing. Please DO NOT use if you have an allergy to CHG or antibacterial soaps.  If your skin becomes reddened/irritated stop using the CHG and inform your nurse when you arrive at Short Stay. Do not shave (including legs and underarms) for at least 48 hours prior to the first CHG shower.  You may shave your face/neck.  Please follow these instructions carefully:  1.  Shower with CHG Soap the night before surgery and the  morning of surgery.  2.  If you choose to wash your hair, wash your hair first as usual with your normal  shampoo.  3.  After you shampoo, rinse  your hair and body thoroughly to remove the shampoo.                             4.  Use CHG as you would any other liquid soap.  You can apply chg directly to the skin and wash.  Gently with a scrungie or clean washcloth.  5.  Apply the CHG Soap to your body ONLY FROM THE NECK DOWN.   Do   not use on face/ open                           Wound or open sores. Avoid contact with eyes, ears mouth and   genitals (private parts).                       Wash face,  Genitals (private parts) with your normal soap.             6.  Wash thoroughly, paying special attention to the area where your    surgery  will be performed.  7.  Thoroughly rinse your body with warm water from the neck down.  8.  DO NOT shower/wash with your normal soap after using and rinsing off the CHG Soap.                9.  Pat yourself dry with a clean towel.            10.  Wear clean pajamas.            11.  Place clean sheets on your bed the night of your first shower and do not  sleep with pets. Day of Surgery : Do not apply any lotions/deodorants the morning of surgery.  Please wear clean clothes to the hospital/surgery center.  FAILURE TO FOLLOW THESE INSTRUCTIONS MAY RESULT IN THE CANCELLATION OF YOUR SURGERY  PATIENT SIGNATURE_________________________________  NURSE SIGNATURE__________________________________  ________________________________________________________________________

## 2023-03-21 NOTE — Progress Notes (Signed)
COVID Vaccine Completed:  Yes  Date of COVID positive in last 90 days:  PCP - Jarrett Soho, PA-C Cardiologist - Belva Crome, MD (last OV 2021)  Chest x-ray -  EKG -  Stress Test -  ECHO - 2-15--21 Epic Cardiac Cath - Yes Pacemaker/ICD device last checked: Spinal Cord Stimulator:  Bowel Prep -   Sleep Study -  CPAP -   Fasting Blood Sugar -  Checks Blood Sugar _____ times a day  Last dose of GLP1 agonist-  N/A GLP1 instructions:  N/A   Last dose of SGLT-2 inhibitors-  N/A SGLT-2 instructions: N/A   Blood Thinner Instructions:  Time Aspirin Instructions: Last Dose:  Activity level:  Can go up a flight of stairs and perform activities of daily living without stopping and without symptoms of chest pain or shortness of breath.  Able to exercise without symptoms  Unable to go up a flight of stairs without symptoms of     Anesthesia review:  Moderate dilatation ascending aorta on echo and CT chest  2021, hx of murmur, elevated coronary calcium score  Patient denies shortness of breath, fever, cough and chest pain at PAT appointment  Patient verbalized understanding of instructions that were given to them at the PAT appointment. Patient was also instructed that they will need to review over the PAT instructions again at home before surgery.

## 2023-03-23 ENCOUNTER — Encounter (HOSPITAL_COMMUNITY)
Admission: RE | Admit: 2023-03-23 | Discharge: 2023-03-23 | Disposition: A | Discharge status: Home / Self Care | Payer: Medicare Other | Source: Ambulatory Visit | Attending: General Surgery | Admitting: General Surgery

## 2023-03-23 ENCOUNTER — Encounter (HOSPITAL_COMMUNITY): Payer: Self-pay

## 2023-03-23 ENCOUNTER — Other Ambulatory Visit: Payer: Self-pay

## 2023-03-23 VITALS — BP 153/94 | HR 77 | Temp 98.0°F | Resp 16 | Ht 70.0 in | Wt 215.2 lb

## 2023-03-23 DIAGNOSIS — Z01818 Encounter for other preprocedural examination: Secondary | ICD-10-CM | POA: Diagnosis present

## 2023-03-23 DIAGNOSIS — I10 Essential (primary) hypertension: Secondary | ICD-10-CM | POA: Diagnosis not present

## 2023-03-23 HISTORY — DX: Cardiac murmur, unspecified: R01.1

## 2023-03-23 LAB — CBC
HCT: 46.6 % (ref 39.0–52.0)
Hemoglobin: 15.8 g/dL (ref 13.0–17.0)
MCH: 32.4 pg (ref 26.0–34.0)
MCHC: 33.9 g/dL (ref 30.0–36.0)
MCV: 95.7 fL (ref 80.0–100.0)
Platelets: 195 10*3/uL (ref 150–400)
RBC: 4.87 MIL/uL (ref 4.22–5.81)
RDW: 13.3 % (ref 11.5–15.5)
WBC: 5.7 10*3/uL (ref 4.0–10.5)
nRBC: 0 % (ref 0.0–0.2)

## 2023-03-23 LAB — BASIC METABOLIC PANEL
Anion gap: 9 (ref 5–15)
BUN: 15 mg/dL (ref 8–23)
CO2: 25 mmol/L (ref 22–32)
Calcium: 9.1 mg/dL (ref 8.9–10.3)
Chloride: 102 mmol/L (ref 98–111)
Creatinine, Ser: 0.97 mg/dL (ref 0.61–1.24)
GFR, Estimated: >60 mL/min (ref >60)
Glucose, Bld: 101 mg/dL — ABNORMAL HIGH (ref 70–99)
Potassium: 3.6 mmol/L (ref 3.5–5.1)
Sodium: 136 mmol/L (ref 135–145)

## 2023-03-30 NOTE — Anesthesia Preprocedure Evaluation (Addendum)
Anesthesia Evaluation  Patient identified by MRN, date of birth, ID band Patient awake    Reviewed: Allergy & Precautions, NPO status , Patient's Chart, lab work & pertinent test results  Airway Mallampati: II  TM Distance: >3 FB Neck ROM: Full    Dental no notable dental hx. (+) Teeth Intact, Dental Advisory Given   Pulmonary former smoker   Pulmonary exam normal breath sounds clear to auscultation       Cardiovascular hypertension, + CAD  Normal cardiovascular exam Rhythm:Regular Rate:Normal  07/2019 Echo  1. Left ventricular ejection fraction, by estimation, is 60 to 65%. The  left ventricle has normal function. LV endocardial border not optimally  defined to evaluate regional wall motion.   2. There is moderate dilatation of the ascending aorta measuring 41 mm.     Neuro/Psych negative neurological ROS  negative psych ROS   GI/Hepatic negative GI ROS, Neg liver ROS,,,  Endo/Other  negative endocrine ROS    Renal/GU Lab Results      Component                Value               Date                          K                        3.6                 03/23/2023                  CREATININE               0.97                03/23/2023                    Musculoskeletal negative musculoskeletal ROS (+)    Abdominal   Peds  Hematology Lab Results      Component                Value               Date                      WBC                      5.7                 03/23/2023                HGB                      15.8                03/23/2023                HCT                      46.6                03/23/2023                MCV  95.7                03/23/2023                PLT                      195                 03/23/2023              Anesthesia Other Findings NKDA  Reproductive/Obstetrics negative OB ROS                             Anesthesia  Physical Anesthesia Plan  ASA: 2  Anesthesia Plan: General   Post-op Pain Management: Precedex, Toradol IV (intra-op)* and Tylenol PO (pre-op)*   Induction: Intravenous  PONV Risk Score and Plan: 3 and Treatment may vary due to age or medical condition, Ondansetron and Midazolam  Airway Management Planned: Oral ETT  Additional Equipment: None  Intra-op Plan:   Post-operative Plan: Extubation in OR  Informed Consent: I have reviewed the patients History and Physical, chart, labs and discussed the procedure including the risks, benefits and alternatives for the proposed anesthesia with the patient or authorized representative who has indicated his/her understanding and acceptance.     Dental advisory given  Plan Discussed with: CRNA  Anesthesia Plan Comments:         Anesthesia Quick Evaluation

## 2023-03-30 NOTE — Progress Notes (Signed)
Left message for patients time change patient is to arrive at 0515 for 0730 surgery

## 2023-03-31 ENCOUNTER — Other Ambulatory Visit: Payer: Self-pay

## 2023-03-31 ENCOUNTER — Ambulatory Visit (HOSPITAL_COMMUNITY): Payer: Medicare Other | Admitting: Physician Assistant

## 2023-03-31 ENCOUNTER — Ambulatory Visit (HOSPITAL_COMMUNITY)
Admission: RE | Admit: 2023-03-31 | Discharge: 2023-03-31 | Disposition: A | Discharge status: Home / Self Care | Payer: Medicare Other | Source: Ambulatory Visit | Attending: General Surgery | Admitting: General Surgery

## 2023-03-31 ENCOUNTER — Ambulatory Visit (HOSPITAL_BASED_OUTPATIENT_CLINIC_OR_DEPARTMENT_OTHER): Payer: Medicare Other | Admitting: Anesthesiology

## 2023-03-31 ENCOUNTER — Encounter (HOSPITAL_COMMUNITY): Payer: Self-pay | Admitting: General Surgery

## 2023-03-31 ENCOUNTER — Other Ambulatory Visit (HOSPITAL_COMMUNITY): Payer: Self-pay

## 2023-03-31 ENCOUNTER — Encounter (HOSPITAL_COMMUNITY)
Admission: RE | Disposition: A | Discharge status: Home / Self Care | Payer: Self-pay | Source: Ambulatory Visit | Attending: General Surgery

## 2023-03-31 DIAGNOSIS — Z87891 Personal history of nicotine dependence: Secondary | ICD-10-CM | POA: Insufficient documentation

## 2023-03-31 DIAGNOSIS — K4091 Unilateral inguinal hernia, without obstruction or gangrene, recurrent: Secondary | ICD-10-CM

## 2023-03-31 DIAGNOSIS — I10 Essential (primary) hypertension: Secondary | ICD-10-CM | POA: Insufficient documentation

## 2023-03-31 HISTORY — PX: XI ROBOTIC ASSISTED INGUINAL HERNIA REPAIR WITH MESH: SHX6706

## 2023-03-31 SURGERY — REPAIR, HERNIA, INGUINAL, ROBOT-ASSISTED, LAPAROSCOPIC, USING MESH
Anesthesia: General | Laterality: Right

## 2023-03-31 MED ORDER — PROPOFOL 10 MG/ML IV BOLUS
INTRAVENOUS | Status: DC | PRN
  Administered 2023-03-31: 200 mg via INTRAVENOUS

## 2023-03-31 MED ORDER — BUPIVACAINE-EPINEPHRINE 0.25% -1:200000 IJ SOLN
INTRAMUSCULAR | Status: DC | PRN
  Administered 2023-03-31: 20 mL

## 2023-03-31 MED ORDER — 0.9 % SODIUM CHLORIDE (POUR BTL) OPTIME
TOPICAL | Status: DC | PRN
  Administered 2023-03-31: 1000 mL

## 2023-03-31 MED ORDER — ONDANSETRON HCL 4 MG/2ML IJ SOLN: 4.0000 mg | Freq: Once | INTRAMUSCULAR | Status: DC | PRN

## 2023-03-31 MED ORDER — FENTANYL CITRATE PF 50 MCG/ML IJ SOSY: 25.0000 ug | PREFILLED_SYRINGE | INTRAMUSCULAR | Status: DC | PRN

## 2023-03-31 MED ORDER — OXYCODONE HCL 5 MG PO TABS: 5.0000 mg | ORAL_TABLET | ORAL | Status: DC | PRN

## 2023-03-31 MED ORDER — FENTANYL CITRATE (PF) 100 MCG/2ML IJ SOLN
INTRAMUSCULAR | Status: DC | PRN
  Administered 2023-03-31 (×2): 50 ug via INTRAVENOUS
  Administered 2023-03-31: 100 ug via INTRAVENOUS

## 2023-03-31 MED ORDER — ONDANSETRON HCL 4 MG/2ML IJ SOLN
INTRAMUSCULAR | Status: AC
Start: 2023-03-31 — End: ?
  Filled 2023-03-31: qty 2

## 2023-03-31 MED ORDER — CHLORHEXIDINE GLUCONATE CLOTH 2 % EX PADS: 6.0000 | MEDICATED_PAD | Freq: Once | CUTANEOUS | Status: DC

## 2023-03-31 MED ORDER — SODIUM CHLORIDE 0.9% FLUSH: 3.0000 mL | Freq: Two times a day (BID) | INTRAVENOUS | Status: DC

## 2023-03-31 MED ORDER — BUPIVACAINE-EPINEPHRINE 0.25% -1:200000 IJ SOLN
INTRAMUSCULAR | Status: AC
  Filled 2023-03-31: qty 1

## 2023-03-31 MED ORDER — CEFAZOLIN SODIUM-DEXTROSE 2-4 GM/100ML-% IV SOLN
2.0000 g | INTRAVENOUS | Status: AC
  Administered 2023-03-31: 2 g via INTRAVENOUS
  Filled 2023-03-31: qty 100

## 2023-03-31 MED ORDER — SODIUM CHLORIDE 0.9 % IV SOLN
250.0000 mL | INTRAVENOUS | Status: DC | PRN
Start: 2023-03-31 — End: 2023-03-31

## 2023-03-31 MED ORDER — ONDANSETRON HCL 4 MG/2ML IJ SOLN
INTRAMUSCULAR | Status: DC | PRN
  Administered 2023-03-31: 4 mg via INTRAVENOUS

## 2023-03-31 MED ORDER — ACETAMINOPHEN 500 MG PO TABS
1000.0000 mg | ORAL_TABLET | Freq: Once | ORAL | Status: AC
  Administered 2023-03-31: 1000 mg via ORAL
  Filled 2023-03-31: qty 2

## 2023-03-31 MED ORDER — SODIUM CHLORIDE 0.9% FLUSH: 3.0000 mL | INTRAVENOUS | Status: DC | PRN

## 2023-03-31 MED ORDER — PHENYLEPHRINE 80 MCG/ML (10ML) SYRINGE FOR IV PUSH (FOR BLOOD PRESSURE SUPPORT)
PREFILLED_SYRINGE | INTRAVENOUS | Status: AC
  Filled 2023-03-31: qty 10

## 2023-03-31 MED ORDER — SUGAMMADEX SODIUM 200 MG/2ML IV SOLN
INTRAVENOUS | Status: DC | PRN
  Administered 2023-03-31: 150 mg via INTRAVENOUS

## 2023-03-31 MED ORDER — PHENYLEPHRINE 80 MCG/ML (10ML) SYRINGE FOR IV PUSH (FOR BLOOD PRESSURE SUPPORT)
PREFILLED_SYRINGE | INTRAVENOUS | Status: DC | PRN
  Administered 2023-03-31 (×2): 80 ug via INTRAVENOUS

## 2023-03-31 MED ORDER — FENTANYL CITRATE (PF) 100 MCG/2ML IJ SOLN
INTRAMUSCULAR | Status: AC
  Filled 2023-03-31: qty 2

## 2023-03-31 MED ORDER — ACETAMINOPHEN 10 MG/ML IV SOLN: 1000.0000 mg | Freq: Once | INTRAVENOUS | Status: DC | PRN

## 2023-03-31 MED ORDER — LACTATED RINGERS IV SOLN: INTRAVENOUS | Status: DC | PRN

## 2023-03-31 MED ORDER — BUPIVACAINE LIPOSOME 1.3 % IJ SUSP
INTRAMUSCULAR | Status: AC
  Filled 2023-03-31: qty 20

## 2023-03-31 MED ORDER — ROCURONIUM BROMIDE 100 MG/10ML IV SOLN
INTRAVENOUS | Status: DC | PRN
  Administered 2023-03-31 (×2): 10 mg via INTRAVENOUS
  Administered 2023-03-31: 70 mg via INTRAVENOUS

## 2023-03-31 MED ORDER — OXYCODONE HCL 5 MG PO TABS
5.0000 mg | ORAL_TABLET | Freq: Three times a day (TID) | ORAL | 0 refills | Status: AC | PRN
  Filled 2023-03-31: qty 12, 4d supply, fill #0

## 2023-03-31 MED ORDER — LIDOCAINE HCL (CARDIAC) PF 100 MG/5ML IV SOSY
PREFILLED_SYRINGE | INTRAVENOUS | Status: DC | PRN
  Administered 2023-03-31: 100 mg via INTRAVENOUS

## 2023-03-31 MED ORDER — ACETAMINOPHEN 325 MG PO TABS: 650.0000 mg | ORAL_TABLET | ORAL | Status: DC | PRN

## 2023-03-31 MED ORDER — PROPOFOL 10 MG/ML IV BOLUS
INTRAVENOUS | Status: AC
  Filled 2023-03-31: qty 20

## 2023-03-31 MED ORDER — DEXAMETHASONE SODIUM PHOSPHATE 10 MG/ML IJ SOLN
INTRAMUSCULAR | Status: AC
  Filled 2023-03-31: qty 1

## 2023-03-31 MED ORDER — ROCURONIUM BROMIDE 10 MG/ML (PF) SYRINGE
PREFILLED_SYRINGE | INTRAVENOUS | Status: AC
  Filled 2023-03-31: qty 10

## 2023-03-31 MED ORDER — LIDOCAINE HCL (PF) 2 % IJ SOLN
INTRAMUSCULAR | Status: AC
  Filled 2023-03-31: qty 5

## 2023-03-31 MED ORDER — EPHEDRINE SULFATE-NACL 50-0.9 MG/10ML-% IV SOSY
PREFILLED_SYRINGE | INTRAVENOUS | Status: DC | PRN
  Administered 2023-03-31 (×2): 5 mg via INTRAVENOUS

## 2023-03-31 MED ORDER — ORAL CARE MOUTH RINSE: 15.0000 mL | Freq: Once | OROMUCOSAL | Status: AC

## 2023-03-31 MED ORDER — BUPIVACAINE LIPOSOME 1.3 % IJ SUSP
INTRAMUSCULAR | Status: DC | PRN
  Administered 2023-03-31: 20 mL

## 2023-03-31 MED ORDER — EPHEDRINE 5 MG/ML INJ
INTRAVENOUS | Status: AC
  Filled 2023-03-31: qty 5

## 2023-03-31 MED ORDER — DEXAMETHASONE SODIUM PHOSPHATE 10 MG/ML IJ SOLN
INTRAMUSCULAR | Status: DC | PRN
  Administered 2023-03-31: 10 mg via INTRAVENOUS

## 2023-03-31 MED ORDER — ACETAMINOPHEN 325 MG PO TABS
650.0000 mg | ORAL_TABLET | Freq: Four times a day (QID) | ORAL | 0 refills | Status: DC
  Filled 2023-03-31: qty 50, 7d supply, fill #0

## 2023-03-31 MED ORDER — CHLORHEXIDINE GLUCONATE 0.12 % MT SOLN
15.0000 mL | Freq: Once | OROMUCOSAL | Status: AC
  Administered 2023-03-31: 15 mL via OROMUCOSAL

## 2023-03-31 MED ORDER — ACETAMINOPHEN 650 MG RE SUPP: 650.0000 mg | RECTAL | Status: DC | PRN

## 2023-03-31 MED ORDER — IBUPROFEN 200 MG PO TABS
600.0000 mg | ORAL_TABLET | Freq: Four times a day (QID) | ORAL | 0 refills | Status: DC
  Filled 2023-03-31: qty 75, 7d supply, fill #0

## 2023-03-31 SURGICAL SUPPLY — 55 items
ADH SKN CLS APL DERMABOND .7 (GAUZE/BANDAGES/DRESSINGS) ×1
ANTIFOG SOL W/FOAM PAD STRL (MISCELLANEOUS) ×1
APL PRP STRL LF DISP 70% ISPRP (MISCELLANEOUS) ×1
APL SWBSTK 6 STRL LF DISP (MISCELLANEOUS)
APPLICATOR COTTON TIP 6 STRL (MISCELLANEOUS) IMPLANT
APPLICATOR COTTON TIP 6IN STRL (MISCELLANEOUS)
BAG COUNTER SPONGE SURGICOUNT (BAG) IMPLANT
BAG SPNG CNTER NS LX DISP (BAG)
BLADE SURG SZ11 CARB STEEL (BLADE) ×1 IMPLANT
CHLORAPREP W/TINT 26 (MISCELLANEOUS) ×1 IMPLANT
COVER MAYO STAND STRL (DRAPES) ×1 IMPLANT
COVER SURGICAL LIGHT HANDLE (MISCELLANEOUS) ×1 IMPLANT
COVER TIP SHEARS 8 DVNC (MISCELLANEOUS) ×1 IMPLANT
DERMABOND ADVANCED .7 DNX12 (GAUZE/BANDAGES/DRESSINGS) ×1 IMPLANT
DRAPE ARM DVNC X/XI (DISPOSABLE) ×3 IMPLANT
DRAPE COLUMN DVNC XI (DISPOSABLE) ×1 IMPLANT
DRIVER NDL LRG 8 DVNC XI (INSTRUMENTS) IMPLANT
DRIVER NDL MEGA SUTCUT DVNCXI (INSTRUMENTS) IMPLANT
DRIVER NDLE LRG 8 DVNC XI (INSTRUMENTS) IMPLANT
DRIVER NDLE MEGA SUTCUT DVNCXI (INSTRUMENTS) IMPLANT
ELECT REM PT RETURN 15FT ADLT (MISCELLANEOUS) ×1 IMPLANT
FORCEPS BPLR 8 MD DVNC XI (FORCEP) ×1 IMPLANT
GAUZE 4X4 16PLY ~~LOC~~+RFID DBL (SPONGE) ×1 IMPLANT
GLOVE BIO SURGEON STRL SZ7 (GLOVE) ×2 IMPLANT
GOWN STRL REUS W/ TWL XL LVL3 (GOWN DISPOSABLE) ×2 IMPLANT
GOWN STRL REUS W/TWL XL LVL3 (GOWN DISPOSABLE) ×2
IRRIG SUCT STRYKERFLOW 2 WTIP (MISCELLANEOUS)
IRRIGATION SUCT STRKRFLW 2 WTP (MISCELLANEOUS) IMPLANT
KIT BASIN OR (CUSTOM PROCEDURE TRAY) ×1 IMPLANT
KIT TURNOVER KIT A (KITS) IMPLANT
MARKER SKIN DUAL TIP RULER LAB (MISCELLANEOUS) ×1 IMPLANT
MESH 3DMAX MID 5X7 RT XLRG (Mesh General) IMPLANT
NDL HYPO 22X1.5 SAFETY MO (MISCELLANEOUS) ×1 IMPLANT
NEEDLE HYPO 22X1.5 SAFETY MO (MISCELLANEOUS) ×1 IMPLANT
OBTURATOR OPTICAL STND 8 DVNC (TROCAR) ×1
OBTURATOR OPTICALSTD 8 DVNC (TROCAR) ×1 IMPLANT
PACK CARDIOVASCULAR III (CUSTOM PROCEDURE TRAY) ×1 IMPLANT
SCISSORS MNPLR CVD DVNC XI (INSTRUMENTS) ×1 IMPLANT
SEAL UNIV 5-12 XI (MISCELLANEOUS) ×3 IMPLANT
SOL ELECTROSURG ANTI STICK (MISCELLANEOUS) ×1
SOLUTION ANTFG W/FOAM PAD STRL (MISCELLANEOUS) ×1 IMPLANT
SOLUTION ELECTROSURG ANTI STCK (MISCELLANEOUS) ×1 IMPLANT
SPIKE FLUID TRANSFER (MISCELLANEOUS) ×1 IMPLANT
SUT MNCRL AB 4-0 PS2 18 (SUTURE) ×1 IMPLANT
SUT VIC AB 2-0 SH 27 (SUTURE) ×1
SUT VIC AB 2-0 SH 27X BRD (SUTURE) ×1 IMPLANT
SUT VLOC 180 2-0 9IN GS21 (SUTURE) IMPLANT
SUT VLOC 3-0 9IN GRN (SUTURE) ×1 IMPLANT
SYR 10ML LL (SYRINGE) ×1 IMPLANT
SYR 20ML LL LF (SYRINGE) ×1 IMPLANT
TAPE STRIPS DRAPE STRL (GAUZE/BANDAGES/DRESSINGS) ×1 IMPLANT
TOWEL GREEN STERILE FF (TOWEL DISPOSABLE) ×1 IMPLANT
TOWEL OR 17X26 10 PK STRL BLUE (TOWEL DISPOSABLE) ×1 IMPLANT
TROCAR Z-THREAD OPTICAL 5X100M (TROCAR) IMPLANT
TUBING INSUFFLATION 10FT LAP (TUBING) ×1 IMPLANT

## 2023-03-31 NOTE — Anesthesia Procedure Notes (Signed)
Procedure Name: Intubation Date/Time: 03/31/2023 7:30 AM  Performed by: Laurita Quint, CRNAPre-anesthesia Checklist: Patient identified, Emergency Drugs available, Suction available and Patient being monitored Patient Re-evaluated:Patient Re-evaluated prior to induction Oxygen Delivery Method: Circle System Utilized Preoxygenation: Pre-oxygenation with 100% oxygen Induction Type: IV induction Ventilation: Mask ventilation without difficulty Laryngoscope Size: Miller and 2 Grade View: Grade I Tube type: Oral Tube size: 7.5 mm Number of attempts: 1 Airway Equipment and Method: Stylet and Oral airway Placement Confirmation: ETT inserted through vocal cords under direct vision, positive ETCO2 and breath sounds checked- equal and bilateral Secured at: 23 cm Tube secured with: Tape Dental Injury: Teeth and Oropharynx as per pre-operative assessment

## 2023-03-31 NOTE — Anesthesia Postprocedure Evaluation (Signed)
Anesthesia Post Note  Patient: Daniel Krueger  Procedure(s) Performed: XI ROBOTIC ASSISTED INGUINAL HERNIA REPAIR WITH MESH (Right)     Patient location during evaluation: PACU Anesthesia Type: General Level of consciousness: awake and alert Pain management: pain level controlled Vital Signs Assessment: post-procedure vital signs reviewed and stable Respiratory status: spontaneous breathing, nonlabored ventilation, respiratory function stable and patient connected to nasal cannula oxygen Cardiovascular status: blood pressure returned to baseline and stable Postop Assessment: no apparent nausea or vomiting Anesthetic complications: no   No notable events documented.  Last Vitals:  Vitals:   03/31/23 1030 03/31/23 1049  BP: 123/81 136/88  Pulse: 72 68  Resp: 18   Temp:  (!) 36.4 C  SpO2: 92% 94%    Last Pain:  Vitals:   03/31/23 1049  TempSrc: Temporal  PainSc: 0-No pain                 Trevor Iha

## 2023-03-31 NOTE — Transfer of Care (Signed)
Immediate Anesthesia Transfer of Care Note  Patient: Daniel Krueger  Procedure(s) Performed: XI ROBOTIC ASSISTED INGUINAL HERNIA REPAIR WITH MESH (Right)  Patient Location: PACU  Anesthesia Type:General  Level of Consciousness: drowsy  Airway & Oxygen Therapy: Patient Spontanous Breathing and Patient connected to face mask oxygen  Post-op Assessment: Report given to RN and Post -op Vital signs reviewed and stable  Post vital signs: Reviewed and stable  Last Vitals:  Vitals Value Taken Time  BP 118/82 03/31/23 0937  Temp    Pulse 66 03/31/23 0939  Resp 12 03/31/23 0939  SpO2 92 % 03/31/23 0939  Vitals shown include unfiled device data.  Last Pain:  Vitals:   03/31/23 0629  TempSrc:   PainSc: 0-No pain      Patients Stated Pain Goal: 6 (03/31/23 1914)  Complications: No notable events documented.

## 2023-03-31 NOTE — Op Note (Signed)
Daniel Krueger ([id number])  Operative Report   Date 03/31/2023  PREOPERATIVE DIAGNOSIS: Recurrent right inguinal hernia  POSTOPERATIVE DIAGNOSIS: Same  PROCEDURE:  Robotic right Inguinal Hernia Repair with Mesh (Bard 3D XL Polypropylene Mesh) Bilateral transverse abdominus plane blocks  SURGEON: Melody Haver, MD  ANESTHESIA: General Anesthesia    INTRAOPERATIVE FINDINGS: Indirect right inguinal hernia containing fat  IMPLANTS: Implant Name Type Inv. Item Serial No. Manufacturer Lot No. LRB No. Used Action  MESH 3DMAX MID 5X7 RT Valinda Party - [id number] Mesh General MESH 3DMAX MID 5X7 RT [employer] [employer] BARD ACCESS [id number] Right 1 Implanted    ESTIMATED BLOOD LOSS: Minimal  COMPLICATIONS: None  SPECIMENS: None  OPERATIVE INDICATIONS: Pt is a 72 y.o. male who underwent an open right inguinal hernia in the past.  He developed a bulge and pain and had imaging that showed a recurrence. The patient desires definitive repair.  The procedure's risks, benefits, and alternatives were explained to the patient.  Risks, including the risks of bleeding, infection, need for mesh removal, and potential for hernia recurrence, were discussed.  The patient agreed to proceed and signed informed consent in front of a witness.   DESCRIPTION OF PROCEDURE: After preoperatively identifying the patient in holding, the patient was brought to the operating room and placed supine on the operating room table.  Both arms were tucked and padded to avoid potential nerve injury.  Sequential Compression Devices were placed bilaterally.  After induction of general anesthesia, the patient was given the appropriate perioperative antibiotics.  The abdomen was prepped and draped in a typical sterile fashion.  A JACHO approved time out, where the name of the patient, the operation, and the intended site were confirmed.  The procedure was begun. A veress needle was inserted in the LUQ at Palmer's point.  Following aspiration  of air and a positive drop test, insufflation was established.  An 8 mm optical port was placed under direct visualization left of the umbilicus.  A camera was introduced into the abdomen, and a thorough inspection of the abdomen was performed to confirm there was no additional pathology.  Two additional 8 mm robotic ports were placed laterally under direct visualization superior to the umbilicus and to the right of the umbilicus. The patient was then placed into 15-degree Trendelenburg position to facilitate examination of the bilateral inguinal spaces.  A thorough inspection of the abdomen was undertaken.  A right inguinal hernia was identified and amenable to robotic repair.  I then proceeded to perform bilateral transverse abdominus plane blocks using bupivocaine under direct laparoscopic visualization.       The [employer] robot was brought into the field and docked.  An 8 mm 30-degree scope was placed in the mid-abdominal port.  The peritoneum was taken down 6 cm superior to the hernia from the anterior abdominal wall, and dissection was taken inferiorly towards the hernia.  Medial to the epigastric vessels, the parietal compartment was dissected and completed to visualize the rectus muscle.  This dissection was carried down to the symphysis pubis and obturator foramen.  The retropubic space was dissected to expose at least 2 cm contralateral to the midline.  Cooper's ligament was exposed and cleared at least 2 cm below the ligament to ensure adequate space for the inferior border of the mesh.  Hesselbach's triangle was cleared, assessing for a direct hernia. There was no evidence of a direct hernia. Any herniated femoral contents were reduced.   Lateral to the epigastric vessels, the  dissection was carried out into the visceral compartment, continuing in the true preperitoneal plane.  The indirect hernia sac was carefully reduced and separated from the cord structures with medial retraction and a  combination of blunt/sharp dissection and focused cautery.  This dissection continued until the cord structures were parietalized completely, allowing for continuous visualization of the reflected peritoneum with the line originating 2 cm below Cooper's medially and across the psoas muscle in the lateral compartment.  The internal ring was interrogated for a cord lipoma.  Any component of cord lipoma was reduced to the retroperitoneum and seated dorsal to the preperitoneal mesh.  Having achieved a complete dissection with a critical view of the entire myopectineal orifice, a piece of Bard 3D XL Polypropylene Mesh was introduced into the field.  It was centered at the iliopubic tract, with the medial side crossing the midline and the inferior edge positioned 2 cm below Cooper's ligament.  With complete coverage of the myopectineal orifice, the inferior edge of the peritoneum was posterior and inferior to the mesh.  The lateral aspect of the mesh extended 3-5 cm beyond the lateral edge of the psoas.  Cephalad retraction of the peritoneal flap did not cause lifting of the inferior mesh edge or cord structures.  The mesh was fixated using an interrupted 3-0 Vicryl suture placed to the ipsilateral Coopers ligament.  The peritoneal flap was closed with a running 3-0 Stratafix barbed suture.  Hemostasis was assured in the entirety of the abdomen.  The two lateral ports were removed under direct visualization.  The abdomen was desufflated under direct visualization.  The port sites were closed, local anesthetic was infiltrated, and Dermabond was applied.  After confirming twice that the sponge, needle, and instrument counts were correct, the procedure was terminated, the patient was extubated and transferred to the recovery room in stable condition.     DISPOSITION: Stable to PACU.   Electronically Signed By: Moise Boring 03/31/2023 9:30 AM

## 2023-03-31 NOTE — H&P (Signed)
     Daniel Krueger [birth year]  [id number].    HPI:  72 y/o M w/ a hx of right inguinal hernia repair who developed a recurrence and now presents for elective repair.  He is in his usual state of health and denies any recent changes in medication or hospitalizations.  ROS: Review of Systems  Constitutional: Negative.   HENT: Negative.    Eyes: Negative.   Respiratory: Negative.    Cardiovascular: Negative.   Gastrointestinal:  Positive for abdominal pain.  Genitourinary: Negative.   Musculoskeletal: Negative.   Skin: Negative.   Neurological: Negative.   Endo/Heme/Allergies: Negative.   Psychiatric/Behavioral: Negative.      History reviewed. No pertinent family history.  Past Medical History:  Diagnosis Date   Heart murmur    At age 72   Hypertension     Past Surgical History:  Procedure Laterality Date   CARDIAC CATHETERIZATION     "they found nothing"   HERNIA REPAIR      Social History:  reports that he quit smoking about 39 years ago. His smoking use included cigarettes. He has been exposed to tobacco smoke. He has never used smokeless tobacco. He reports that he does not drink alcohol and does not use drugs.  Allergies: No Known Allergies  Medications Prior to Admission  Medication Sig Dispense Refill   atorvastatin (LIPITOR) 20 MG tablet Take 20 mg by mouth daily.     fluticasone (FLONASE) 50 MCG/ACT nasal spray Place 1 spray into both nostrils daily as needed for allergies.     hydrochlorothiazide (HYDRODIURIL) 12.5 MG tablet Take 12.5 mg by mouth every morning.     hydroxypropyl methylcellulose / hypromellose (ISOPTO TEARS / GONIOVISC) 2.5 % ophthalmic solution Place 1 drop into both eyes as needed for dry eyes.     lisinopril (ZESTRIL) 40 MG tablet Take 40 mg by mouth daily.      Physical Exam: Blood pressure (!) 147/91, pulse 68, temperature 98.4 F (36.9 C), temperature source Oral, resp. rate 16, height 5\' 10"  (1.778 m), weight 97.6 kg, SpO2  94%. Gen: male resting in bed Abd: soft, non-distended Groin: right sided bulge Neuro: moving all extremities   No results found for this or any previous visit (from the past 48 hour(s)). No results found.  Assessment/Plan 72 y/o M w/ a recurrent right inguinal hernia who presents for elective repair  - Will proceed to the OR. We discussed the benefits and potential risks, including but not limited to: bleeding, infection, damage to the bowel or surrounding structures, mesh complications, chronic pain.  Written consent was obtained - Tentative plan for discharge from PACU  [facility] 03/31/2023, 7:12 AM Please see Amion for pager number during day hours 7:00am-4:30pm or 7:00am -11:30am on weekends

## 2023-03-31 NOTE — Discharge Instructions (Signed)
Home Care After Hernia Repair   Activity  Limit activity for the first 24 hours, then you may return to normal daily activities. Returning to normal daily activities as soon as you can following surgery will enhance recovery time.  No heavy lifting pushing or pulling, anything heavier than 10 pounds (gallon of milk weighs approx. 8.8 pounds) for 4-6 weeks from surgery date.  Do not mow the lawn, use a vacuum cleaner, or do any other strenuous activities without first consulting your surgical team.  Climb stairs slowly and watch your step.  Walk as often as you feel able to increase strength and endurance.  No driving or operating heavy machinery within 24 hours of taking narcotic pain medication.  Diet  Drink plenty of fluids and eat a light meal on the night of surgery. Some patients may find their appetite is poor for a week or two after surgery. This is a normal result of the stress of surgery-your appetite will return in time.   There are no specific diet restrictions after surgery.  Dressings and Wound Care o Dermabond/Durabond (skin glue): This will usually remain in place for 10-14 days, then naturally fall off your skin. You may take a shower 24 hrs after surgery, carefully wash, not scrub the incision site with a mild non-scented soap. Pat dry with a soft towel.  Do not pick or peel skin glue off.  You can shower and let the water fall on the dressings above. Do not soak or submerge your incision(s) in a bath tub, hot tub, or swimming pool, until your doctor says it is ok to do so or the incision(s) have completely healed, usually about 2-4 weeks.  Do not use creams, powder, salves or balms on your incision(s).    What to Expect After Surgery   Moderate discomfort controlled with medications  Minimal drainage from incision  You may feel pain in one or both shoulders. This pain comes from the gas still left in your belly after the surgery, if you had laparoscopic surgery (several  small incisions). The pain should ease over several days to a week. Ambulation will help with this pain.   Belly swelling  Feeling fatigue and weak  Constipation after surgery is common. Drink plenty fluids and eat a high fiber diet.  Swelling - In some patients might feel that their hernia has returned after surgery-DO NOT Worry this is normal. Swelling may be due to the development of a seroma. Seroma is fluid that has built up where the hernia was repaired this is a normal result after surgery and it will slowly reabsorb back into your body over the next several weeks.   (MALE PATIENTS ONLY)-esp following inguinal hernia repair, it is expected that your scrotum may be slightly swollen or tender. Along with oral NSAIDS medications you can apply ice packs, wear compression shorts, and/or elevate scrotum using a rolled up towel.  Also, a bladder catheter may have been placed during your surgery.  Because of this, it may hurt to urinate for a couple of days or you may pass some blood clots. These issues are expected and will go away after a few days. Please notify surgeon or report to Emergency Dept. if you are unable to urinate or your symptoms worsen.    Pain Control: Prescribed Non-Narcotic Pain Medication  You will be given three prescriptions.  Two of them will be for prescription strength ibuprofen (i.e. Advil) and prescription strength acetaminophen (i.e. Tylenol).  The vast majority  of patients will just need these two medications.  One prescription will be for a 'rescue' prescription of an oral narcotic (oxycodone).  You may fill this if needed.  You will alternate taking the ibuprofen (600mg ) every 6 hours and also the acetaminophen (650mg ) every 6 hours so that you are taking one of those medications every 3 hours.  For example: o 0800 - take ibuprofen 600mg  o 1100 - take acetaminophen 650mg  o 1400 - take ibuprofen 600mg  o 1700 - take acetaminophen 650mg  o Etc.  Continue taking this  alternating pattern of ibuprofen and acetaminophen for 3 days  If you cannot take one or the other of these medications, just take the one you can every 6 hours.  If you are comfortable at night, you don't have to wake up and take a medication.  If you are still uncomfortable after taking either ibuprofen or acetaminophen, try gentle stretching exercise and ice packs (a bag of frozen vegetables works great).  If you are still uncomfortable, you may fill the narcotic prescription of Oxycodone and take as directed.  Once you have completed these prescriptions, your pain level should be low enough to stop taking medications altogether or just use an over the counter medication (ibuprofen or acetaminophen) as needed.   Pain Control: Over the Counter Medications to take as needed:  Colace/Docusate: May be prescribed by your surgeon to prevent constipation caused by the combination of narcotics, effects of anesthesia, and decreased ambulation.  Hold for loose stools or diarrhea. Take 100 mg 1-2 times a day starting tonight.   Fiber: High fiber foods, extra liquids (water 9-13 cups/day) can also assist with constipation. Examples of high fiber foods are fruit, bran. Prune juice and water are also good liquids to drink.  Milk of Magnesia/Miralax:  If constipated despite take the over the counter stool softeners, you may take Milk of Magnesia or Miralax as directed on bottle to assist with constipation.     Pepcid/Famotidine: May be prescribed while taking naproxen (Aleve) or other NSAIDs such as ibuprofen (Motrin/Advil) to prevent stomach upset or Acid-reflux symptoms. Take 1 tablet 1-2 times a day.   **Constipation: The first bowel movement may occur anywhere between 1-5 days after surgery.  As long as you are not nauseated or not having significant abdominal pain this variation is acceptable.  Narcotic pain medications can cause constipation increasing discomfort; early discontinuation will assist with bowel  management.If constipated despite taking stool softeners, you may take Milk of Magnesia or Miralax as directed on the bottle.     **Home medications: You may restart your home medications as directed by your respective Primary Care Physician or Surgeon.  When to notify your Doctor or Healthcare Team:   Sign of Wound Infection   Fever over 100 degrees.  Wound becomes extremely swollen, shows red streaks, warm to the touch, and/or drainage from the incision site or foul-smelling drainage.  Wound edges separate or opens up  Bleeding or bruising   If you have bleeding, apply pressure to the site and hold the pressure firmly for 5 minutes. If the bleeding continues, apply pressure again and call 911. If the bleeding stopped, call your doctor to report it.   Call your doctor or nurse if you have increased bleeding from your site and increased bruising or a lump forms or gets larger under your skin at the site.  Unrelieved Pain    Call your doctor or nurse if your pain gets worse or is not eased 1  hour after taking your pain medicine, or if it is severe and uncontrolled.  Nausea and Vomiting   Call your doctor or nurse if you have nausea and vomiting that continues more than 24 hours, will not let you keep medicine down and will not let you keep fluids down  Fever, Flu-like symptoms   Fever over 100 degrees and/or chills  Gastrointestinal Bleeding Symptoms    Black tarry bowel movements.  This can be normal after surgery on the stomach, but should resolve in a day or two.    Call 911 if you suddenly have signs of blood loss such as:  Vomiting blood  Fast heart rate  Feeling faint, sweaty, or blacking out  Passing bright red blood from your rectum  Blood Clot Symptoms   Tender, swollen or reddened areas in your calf muscle or thighs.  Numbness or tingling in your lower leg or calf, or at the top of your leg or groin  Skin on your leg looks pale or blue or feels cold to touch  Chest pain  or have trouble breathing, lightheadedness, fast heart rate  Sudden Onset of Symptoms    Call 911 if you suddenly have:  Leg weakness and spasm  Loss of bladder or bowel function  Seizure  Confusion, severe headache, dizziness or feeling unsteady, problems talking, difficulty swallowing, and/or numbness or muscle weakness as these could be signs of a stroke.   Follow up Appointment Your follow up appointment should be scheduled 2-3 weeks after your surgery date.  If you have not previously scheduled for a follow-up visit you can be scheduled by contacting [phone].

## 2023-04-03 ENCOUNTER — Encounter (HOSPITAL_COMMUNITY): Payer: Self-pay | Admitting: General Surgery

## 2023-09-27 ENCOUNTER — Other Ambulatory Visit: Payer: Self-pay | Admitting: Family Medicine

## 2023-09-27 DIAGNOSIS — I77810 Thoracic aortic ectasia: Secondary | ICD-10-CM

## 2023-10-06 ENCOUNTER — Other Ambulatory Visit

## 2023-12-10 ENCOUNTER — Other Ambulatory Visit: Payer: Self-pay

## 2023-12-10 ENCOUNTER — Emergency Department (HOSPITAL_BASED_OUTPATIENT_CLINIC_OR_DEPARTMENT_OTHER)

## 2023-12-10 ENCOUNTER — Emergency Department (HOSPITAL_BASED_OUTPATIENT_CLINIC_OR_DEPARTMENT_OTHER)
Admission: EM | Admit: 2023-12-10 | Discharge: 2023-12-10 | Disposition: A | Discharge status: Home / Self Care | Attending: Emergency Medicine | Admitting: Emergency Medicine

## 2023-12-10 DIAGNOSIS — K8050 Calculus of bile duct without cholangitis or cholecystitis without obstruction: Secondary | ICD-10-CM | POA: Insufficient documentation

## 2023-12-10 DIAGNOSIS — R1011 Right upper quadrant pain: Secondary | ICD-10-CM | POA: Diagnosis present

## 2023-12-10 LAB — COMPREHENSIVE METABOLIC PANEL WITH GFR
ALT: 26 U/L (ref 0–44)
AST: 22 U/L (ref 15–41)
Albumin: 4.7 g/dL (ref 3.5–5.0)
Alkaline Phosphatase: 91 U/L (ref 38–126)
Anion gap: 15 (ref 5–15)
BUN: 14 mg/dL (ref 8–23)
CO2: 25 mmol/L (ref 22–32)
Calcium: 9.9 mg/dL (ref 8.9–10.3)
Chloride: 99 mmol/L (ref 98–111)
Creatinine, Ser: 0.98 mg/dL (ref 0.61–1.24)
GFR, Estimated: >60 mL/min (ref >60)
Glucose, Bld: 176 mg/dL — ABNORMAL HIGH (ref 70–99)
Potassium: 3.8 mmol/L (ref 3.5–5.1)
Sodium: 139 mmol/L (ref 135–145)
Total Bilirubin: 0.5 mg/dL (ref 0.0–1.2)
Total Protein: 7.5 g/dL (ref 6.5–8.1)

## 2023-12-10 LAB — CBC
HCT: 45.8 % (ref 39.0–52.0)
Hemoglobin: 15.9 g/dL (ref 13.0–17.0)
MCH: 32.4 pg (ref 26.0–34.0)
MCHC: 34.7 g/dL (ref 30.0–36.0)
MCV: 93.5 fL (ref 80.0–100.0)
Platelets: 214 K/uL (ref 150–400)
RBC: 4.9 MIL/uL (ref 4.22–5.81)
RDW: 13.1 % (ref 11.5–15.5)
WBC: 9.7 K/uL (ref 4.0–10.5)
nRBC: 0 % (ref 0.0–0.2)

## 2023-12-10 LAB — URINALYSIS, ROUTINE W REFLEX MICROSCOPIC
Bilirubin Urine: NEGATIVE
Glucose, UA: NEGATIVE mg/dL
Hgb urine dipstick: NEGATIVE
Ketones, ur: NEGATIVE mg/dL
Leukocytes,Ua: NEGATIVE
Nitrite: NEGATIVE
Protein, ur: NEGATIVE mg/dL
Specific Gravity, Urine: 1.023 (ref 1.005–1.030)
pH: 5.5 (ref 5.0–8.0)

## 2023-12-10 LAB — LIPASE, BLOOD: Lipase: 28 U/L (ref 11–51)

## 2023-12-10 MED ORDER — IOHEXOL 300 MG/ML  SOLN
100.0000 mL | Freq: Once | INTRAMUSCULAR | Status: AC | PRN
  Administered 2023-12-10: 100 mL via INTRAVENOUS

## 2023-12-10 MED ORDER — OXYCODONE-ACETAMINOPHEN 5-325 MG PO TABS: 1.0000 | ORAL_TABLET | ORAL | 0 refills | Status: DC | PRN

## 2023-12-10 MED ORDER — ONDANSETRON HCL 4 MG/2ML IJ SOLN
4.0000 mg | Freq: Once | INTRAMUSCULAR | Status: AC
  Administered 2023-12-10: 4 mg via INTRAVENOUS
  Filled 2023-12-10: qty 2

## 2023-12-10 MED ORDER — HYDROMORPHONE HCL 1 MG/ML IJ SOLN
1.0000 mg | Freq: Once | INTRAMUSCULAR | Status: AC
  Administered 2023-12-10: 1 mg via INTRAVENOUS
  Filled 2023-12-10: qty 1

## 2023-12-10 NOTE — ED Provider Notes (Signed)
 [place] EMERGENCY DEPARTMENT AT [facility] Provider Note   CSN: [id number] Arrival date & time: 12/10/23  0003     Patient presents with: Abdominal Pain   Daniel Krueger is a 73 y.o. male.   Presents to the emergency department for evaluation of abdominal pain with nausea and vomiting.  Symptoms began around 9 PM.  He reports that he has been having waxing and waning pain in the right abdomen associated with nausea, vomiting and cold sweats.       Prior to Admission medications   Medication Sig Start Date End Date Taking? Authorizing Provider  acetaminophen  (TYLENOL ) 325 MG tablet Take 2 tablets (650 mg total) by mouth every 6 (six) hours. 03/31/23   Idler Cordella LABOR, MD  atorvastatin (LIPITOR) 20 MG tablet Take 20 mg by mouth daily. 11/09/18   [provider]  fluticasone (FLONASE) 50 MCG/ACT nasal spray Place 1 spray into both nostrils daily as needed for allergies. 01/26/23   [provider]  hydrochlorothiazide (HYDRODIURIL) 12.5 MG tablet Take 12.5 mg by mouth every morning. 06/24/19   [provider]  hydroxypropyl methylcellulose / hypromellose (ISOPTO TEARS / GONIOVISC) 2.5 % ophthalmic solution Place 1 drop into both eyes as needed for dry eyes.    [provider]  ibuprofen  (MOTRIN  IB) 200 MG tablet Take 3 tablets (600 mg total) by mouth every 6 (six) hours. 03/31/23   Idler Cordella LABOR, MD  lisinopril (ZESTRIL) 40 MG tablet Take 40 mg by mouth daily. 12/06/18   [provider]    Allergies: Patient has no known allergies.    Review of Systems  Updated Vital Signs BP 130/86   Pulse 70   Temp (!) 97.5 F (36.4 C) (Oral)   Resp 17   Ht 5' 10 (1.778 m)   Wt 97.5 kg   SpO2 92%   BMI 30.85 kg/m   Physical Exam Vitals and nursing note reviewed.  Constitutional:      General: He is not in acute distress.    Appearance: He is well-developed.  HENT:     Head: Normocephalic and atraumatic.     Mouth/Throat:      Mouth: Mucous membranes are moist.  Eyes:     General: Vision grossly intact. Gaze aligned appropriately.     Extraocular Movements: Extraocular movements intact.     Conjunctiva/sclera: Conjunctivae normal.  Cardiovascular:     Rate and Rhythm: Normal rate and regular rhythm.     Pulses: Normal pulses.     Heart sounds: Normal heart sounds, S1 normal and S2 normal. No murmur heard.    No friction rub. No gallop.  Pulmonary:     Effort: Pulmonary effort is normal. No respiratory distress.     Breath sounds: Normal breath sounds.  Abdominal:     Palpations: Abdomen is soft.     Tenderness: There is abdominal tenderness in the right upper quadrant. There is no guarding or rebound.     Hernia: No hernia is present.  Musculoskeletal:        General: No swelling.     Cervical back: Full passive range of motion without pain, normal range of motion and neck supple. No pain with movement, spinous process tenderness or muscular tenderness. Normal range of motion.     Right lower leg: No edema.     Left lower leg: No edema.  Skin:    General: Skin is warm and dry.     Capillary Refill: Capillary refill takes  less than 2 seconds.     Findings: No ecchymosis, erythema, lesion or wound.  Neurological:     Mental Status: He is alert and oriented to person, place, and time.     GCS: GCS eye subscore is 4. GCS verbal subscore is 5. GCS motor subscore is 6.     Cranial Nerves: Cranial nerves 2-12 are intact.     Sensory: Sensation is intact.     Motor: Motor function is intact. No weakness or abnormal muscle tone.     Coordination: Coordination is intact.  Psychiatric:        Mood and Affect: Mood normal.        Speech: Speech normal.        Behavior: Behavior normal.     (all labs ordered are listed, but only abnormal results are displayed) Labs Reviewed  COMPREHENSIVE METABOLIC PANEL WITH GFR - Abnormal; Notable for the following components:      Result Value   Glucose, Bld 176 (*)     All other components within normal limits  LIPASE, BLOOD  CBC  URINALYSIS, ROUTINE W REFLEX MICROSCOPIC    EKG: EKG Interpretation Date/Time:  Sunday December 10 2023 00:36:32 EDT Ventricular Rate:  70 PR Interval:  219 QRS Duration:  97 QT Interval:  397 QTC Calculation: 429 R Axis:   -47  Text Interpretation: Sinus rhythm Borderline prolonged PR interval Inferior infarct, old No significant change since last tracing Confirmed by Haze Lonni PARAS [phone]) on 12/10/2023 12:57:40 AM  Radiology: CT ABDOMEN PELVIS W CONTRAST Result Date: 12/10/2023 CLINICAL DATA:  Right-sided abdominal pain EXAM: CT ABDOMEN AND PELVIS WITH CONTRAST TECHNIQUE: Multidetector CT imaging of the abdomen and pelvis was performed using the standard protocol following bolus administration of intravenous contrast. RADIATION DOSE REDUCTION: This exam was performed according to the departmental dose-optimization program which includes automated exposure control, adjustment of the mA and/or kV according to patient size and/or use of iterative reconstruction technique. CONTRAST:  OMNIPAQUE  IOHEXOL  300 MG/ML  SOLN COMPARISON:  12/21/2022 FINDINGS: Lower chest: No acute abnormality. Hepatobiliary: Liver is within normal limits. Gallbladder again demonstrates dependent gallstones. No complicating factors seen. Pancreas: Unremarkable. No pancreatic ductal dilatation or surrounding inflammatory changes. Spleen: Normal in size without focal abnormality. Scattered splenic granulomas are seen. Adrenals/Urinary Tract: Adrenal glands are within normal limits. Kidneys demonstrate a normal enhancement pattern bilaterally. Punctate nonobstructing stone is noted in the left lower pole stable from the prior exam. Ureters are within normal limits. The bladder is decompressed. Stomach/Bowel: The appendix is within normal limits. Small bowel and stomach are unremarkable. No obstructive or inflammatory changes of the colon are seen.  Vascular/Lymphatic: Aortic atherosclerosis. No enlarged abdominal or pelvic lymph nodes. Reproductive: Prostate is unremarkable. Other: No abdominal wall hernia or abnormality. No abdominopelvic ascites. Musculoskeletal: No acute or significant osseous findings. Stable bone island in the L5 vertebral body is noted. IMPRESSION: Cholelithiasis without complicating factors. Punctate nonobstructing left renal stones stable from the prior exam. No acute abnormality noted. Electronically Signed   By: Oneil Devonshire M.D.   On: 12/10/2023 01:55     Procedures   Medications Ordered in the ED  HYDROmorphone  (DILAUDID ) injection 1 mg (1 mg Intravenous Given 12/10/23 0025)  ondansetron  (ZOFRAN ) injection 4 mg (4 mg Intravenous Given 12/10/23 0025)  iohexol  (OMNIPAQUE ) 300 MG/ML solution 100 mL (100 mLs Intravenous Contrast Given 12/10/23 0144)  Medical Decision Making Amount and/or Complexity of Data Reviewed External Data Reviewed: radiology and ECG. Labs: ordered. Decision-making details documented in ED Course. Radiology: ordered and independent interpretation performed. Decision-making details documented in ED Course. ECG/medicine tests: ordered and independent interpretation performed. Decision-making details documented in ED Course.  Risk Prescription drug management.   Differential Diagnosis considered includes, but not limited to: Cholelithiasis; cholecystitis; cholangitis; bowel obstruction; esophagitis; gastritis; peptic ulcer disease; pancreatitis; cardiac.  Presents to the urgency department for evaluation of abdominal pain for several hours.  Pain is on the right side.  Examination reveals right mid abdominal tenderness, not specifically right upper quadrant and no right lower quadrant tenderness.  Review of prior CTs does show that he has a known gallstone but has never been symptomatic.  Patient's vital signs are unremarkable.  No leukocytosis.  LFTs,  lipase normal.  Discussed short-term observation versus performing CT tonight.  Patient opted for CT scan.  Scan does show dependent gallstones without signs of cholecystitis or other acute findings.  Patient improved with analgesia.  Will discharge, follow-up with surgery outpatient.  Given return precautions.     Final diagnoses:  Biliary colic    ED Discharge Orders     None          Haze Lonni PARAS, MD 12/10/23 0202

## 2023-12-10 NOTE — ED Triage Notes (Signed)
 Pt POV reporting R side abd pain, n/v, and chills that began around 2100, concerned about gallbladder.

## 2023-12-22 ENCOUNTER — Ambulatory Visit: Payer: Self-pay | Admitting: General Surgery

## 2023-12-25 ENCOUNTER — Other Ambulatory Visit: Payer: Self-pay | Admitting: Family Medicine

## 2023-12-25 DIAGNOSIS — I77810 Thoracic aortic ectasia: Secondary | ICD-10-CM

## 2024-02-14 ENCOUNTER — Encounter (HOSPITAL_COMMUNITY): Admission: RE | Admit: 2024-02-14 | Source: Ambulatory Visit

## 2024-03-19 NOTE — Patient Instructions (Signed)
 SURGICAL WAITING ROOM VISITATION  Patients having surgery or a procedure may have no more than 2 support people in the waiting area - these visitors may rotate.    Children under the age of 19 must have an adult with them who is not the patient.  Visitors with respiratory illnesses are discouraged from visiting and should remain at home.  If the patient needs to stay at the hospital during part of their recovery, the visitor guidelines for inpatient rooms apply. Pre-op nurse will coordinate an appropriate time for 1 support person to accompany patient in pre-op.  This support person may not rotate.    Please refer to the [facility] website for the visitor guidelines for Inpatients (after your surgery is over and you are in a regular room).       Your procedure is scheduled on: 04/02/24   Report to [facility] Main Entrance    Report to admitting at 5:15 AM   Call this number if you have problems the morning of surgery [phone]   Do not eat food :After Midnight.   After Midnight you may have the following liquids until 4;30 AM DAY OF SURGERY  Water Non-Citrus Juices (without pulp, NO RED-Apple, White grape, White cranberry) Black Coffee (NO MILK/CREAM OR CREAMERS, sugar ok)  Clear Tea (NO MILK/CREAM OR CREAMERS, sugar ok) regular and decaf                             Plain Jell-O (NO RED)                                           Fruit ices (not with fruit pulp, NO RED)                                     Popsicles (NO RED)                                                               Sports drinks like Gatorade (NO RED)                  Oral Hygiene is also important to reduce your risk of infection.                                    Remember - BRUSH YOUR TEETH THE MORNING OF SURGERY WITH YOUR REGULAR TOOTHPASTE  DENTURES WILL BE REMOVED PRIOR TO SURGERY PLEASE DO NOT APPLY Poly grip OR ADHESIVES!!!    Stop all vitamins and herbal supplements 7 days before  surgery.   Take these medicines the morning of surgery with A SIP OF WATER: Tylenol , atorvastatin, nasal spray, eye drops             You may not have any metal on your body including hair pins, jewelry, and body piercing             Do not wear make-up, lotions, powders, perfumes/cologne, or deodorant  Men may shave face and neck.   Do not bring valuables to the hospital. [place] IS NOT             RESPONSIBLE   FOR VALUABLES.   Contacts, glasses, dentures or bridgework may not be worn into surgery.  DO NOT BRING YOUR HOME MEDICATIONS TO THE HOSPITAL. PHARMACY WILL DISPENSE MEDICATIONS LISTED ON YOUR MEDICATION LIST TO YOU DURING YOUR ADMISSION IN THE HOSPITAL!    Patients discharged on the day of surgery will not be allowed to drive home.  Someone NEEDS to stay with you for the first 24 hours after anesthesia.   Special Instructions: Bring a copy of your healthcare power of attorney and living will documents the day of surgery if you haven't scanned them before.              Please read over the following fact sheets you were given: IF YOU HAVE QUESTIONS ABOUT YOUR PRE-OP INSTRUCTIONS PLEASE [phone] [place]   If you received a COVID test during your pre-op visit  it is requested that you wear a mask when out in public, stay away from anyone that may not be feeling well and notify your surgeon if you develop symptoms. If you test positive for Covid or have been in contact with anyone that has tested positive in the last 10 days please notify you surgeon.     - Preparing for Surgery Before surgery, you can play an important role.  Because skin is not sterile, your skin needs to be as free of germs as possible.  You can reduce the number of germs on your skin by washing with CHG (chlorahexidine gluconate) soap before surgery.  CHG is an antiseptic cleaner which kills germs and bonds with the skin to continue killing germs even after washing. Please DO  NOT use if you have an allergy to CHG or antibacterial soaps.  If your skin becomes reddened/irritated stop using the CHG and inform your nurse when you arrive at Short Stay. Do not shave (including legs and underarms) for at least 48 hours prior to the first CHG shower.  You may shave your face/neck.  Please follow these instructions carefully:  1.  Shower with CHG Soap the night before surgery ONLY (DO NOT USE THE SOAP THE MORNING OF SURGERY).  2.  If you choose to wash your hair, wash your hair first as usual with your normal  shampoo.  3.  After you shampoo, rinse your hair and body thoroughly to remove the shampoo.                             4.  Use CHG as you would any other liquid soap.  You can apply chg directly to the skin and wash.  Gently with a scrungie or clean washcloth.  5.  Apply the CHG Soap to your body ONLY FROM THE NECK DOWN.   Do   not use on face/ open                           Wound or open sores. Avoid contact with eyes, ears mouth and   genitals (private parts).                       Wash face,  Genitals (private parts) with your normal soap.  6.  Wash thoroughly, paying special attention to the area where your    surgery  will be performed.  7.  Thoroughly rinse your body with warm water from the neck down.  8.  DO NOT shower/wash with your normal soap after using and rinsing off the CHG Soap.                9.  Pat yourself dry with a clean towel.            10.  Wear clean pajamas.            11.  Place clean sheets on your bed the night of your first shower and do not  sleep with pets. Day of Surgery : Do not apply any CHG, lotions/deodorants the morning of surgery.  Please wear clean clothes to the hospital/surgery center.  FAILURE TO FOLLOW THESE INSTRUCTIONS MAY RESULT IN THE CANCELLATION OF YOUR SURGERY  PATIENT SIGNATURE_________________________________  NURSE  SIGNATURE__________________________________  ________________________________________________________________________

## 2024-03-19 NOTE — Progress Notes (Signed)
 COVID Vaccine received:  []  No [x]  Yes Date of any COVID positive Test in last 90 days: no PCP - Charmaine Bright PA-C Cardiologist - Jennifer Crape MD  Chest x-ray -  EKG -  12/12/23 Epic Stress Test - 12/14/18 Epic ECHO - 07/15/19 Epic Cardiac Cath -   Bowel Prep - [x]  No  []   Yes ______  Pacemaker / ICD device [x]  No []  Yes   Spinal Cord Stimulator:[x]  No []  Yes       History of Sleep Apnea? [x]  No []  Yes   CPAP used?- [x]  No []  Yes    Does the patient monitor blood sugar?          [x]  No []  Yes  []  N/A  Patient has: [x]  NO Hx DM   []  Pre-DM                 []  DM1  []   DM2 Does patient have a [employer] or Dexacom? []  No []  Yes   Fasting Blood Sugar Ranges-  Checks Blood Sugar _____ times a day  GLP1 agonist / usual dose - no GLP1 instructions:  SGLT-2 inhibitors / usual dose - no SGLT-2 instructions:   Blood Thinner / Instructions:no Aspirin Instructions:no  Comments:   Activity level: Patient is able to climb a flight of stairs without difficulty; [x]  No CP  [x]  No SOB,    Patient can  perform ADLs without assistance.   Anesthesia review: HTN, Murmur, aortic root dilation  Patient denies shortness of breath, fever, cough and chest pain at PAT appointment.  Patient verbalized understanding and agreement to the Pre-Surgical Instructions that were given to them at this PAT appointment. Patient was also educated of the need to review these PAT instructions again prior to his/her surgery.I reviewed the appropriate phone numbers to call if they have any and questions or concerns.

## 2024-03-20 ENCOUNTER — Other Ambulatory Visit: Payer: Self-pay

## 2024-03-20 ENCOUNTER — Encounter (HOSPITAL_COMMUNITY)
Admission: RE | Admit: 2024-03-20 | Discharge: 2024-03-20 | Disposition: A | Discharge status: Home / Self Care | Source: Ambulatory Visit | Attending: General Surgery | Admitting: General Surgery

## 2024-03-20 VITALS — BP 157/99 | HR 71 | Temp 97.5°F | Resp 16 | Ht 70.0 in | Wt 214.0 lb

## 2024-03-20 DIAGNOSIS — I10 Essential (primary) hypertension: Secondary | ICD-10-CM | POA: Diagnosis not present

## 2024-03-20 DIAGNOSIS — Z01812 Encounter for preprocedural laboratory examination: Secondary | ICD-10-CM | POA: Insufficient documentation

## 2024-03-20 DIAGNOSIS — Z01818 Encounter for other preprocedural examination: Secondary | ICD-10-CM

## 2024-03-20 LAB — CBC
HCT: 47.9 % (ref 39.0–52.0)
Hemoglobin: 15.9 g/dL (ref 13.0–17.0)
MCH: 31.4 pg (ref 26.0–34.0)
MCHC: 33.2 g/dL (ref 30.0–36.0)
MCV: 94.5 fL (ref 80.0–100.0)
Platelets: 193 K/uL (ref 150–400)
RBC: 5.07 MIL/uL (ref 4.22–5.81)
RDW: 12.9 % (ref 11.5–15.5)
WBC: 5.4 K/uL (ref 4.0–10.5)
nRBC: 0 % (ref 0.0–0.2)

## 2024-03-20 LAB — BASIC METABOLIC PANEL WITH GFR
Anion gap: 12 (ref 5–15)
BUN: 14 mg/dL (ref 8–23)
CO2: 24 mmol/L (ref 22–32)
Calcium: 9 mg/dL (ref 8.9–10.3)
Chloride: 102 mmol/L (ref 98–111)
Creatinine, Ser: 0.89 mg/dL (ref 0.61–1.24)
GFR, Estimated: >60 mL/min (ref >60)
Glucose, Bld: 107 mg/dL — ABNORMAL HIGH (ref 70–99)
Potassium: 3.8 mmol/L (ref 3.5–5.1)
Sodium: 137 mmol/L (ref 135–145)

## 2024-04-02 ENCOUNTER — Ambulatory Visit (HOSPITAL_BASED_OUTPATIENT_CLINIC_OR_DEPARTMENT_OTHER)

## 2024-04-02 ENCOUNTER — Other Ambulatory Visit (HOSPITAL_COMMUNITY): Payer: Self-pay

## 2024-04-02 ENCOUNTER — Other Ambulatory Visit: Payer: Self-pay

## 2024-04-02 ENCOUNTER — Encounter (HOSPITAL_COMMUNITY)
Admission: RE | Disposition: A | Discharge status: Home / Self Care | Payer: Self-pay | Source: Ambulatory Visit | Attending: General Surgery

## 2024-04-02 ENCOUNTER — Ambulatory Visit (HOSPITAL_COMMUNITY): Payer: Self-pay | Admitting: Physician Assistant

## 2024-04-02 ENCOUNTER — Encounter (HOSPITAL_COMMUNITY): Payer: Self-pay | Admitting: General Surgery

## 2024-04-02 ENCOUNTER — Ambulatory Visit (HOSPITAL_COMMUNITY)
Admission: RE | Admit: 2024-04-02 | Discharge: 2024-04-02 | Disposition: A | Discharge status: Home / Self Care | Source: Ambulatory Visit | Attending: General Surgery | Admitting: General Surgery

## 2024-04-02 DIAGNOSIS — I10 Essential (primary) hypertension: Secondary | ICD-10-CM | POA: Diagnosis not present

## 2024-04-02 DIAGNOSIS — Z87891 Personal history of nicotine dependence: Secondary | ICD-10-CM | POA: Insufficient documentation

## 2024-04-02 DIAGNOSIS — K8010 Calculus of gallbladder with chronic cholecystitis without obstruction: Secondary | ICD-10-CM | POA: Diagnosis present

## 2024-04-02 DIAGNOSIS — K8020 Calculus of gallbladder without cholecystitis without obstruction: Secondary | ICD-10-CM | POA: Diagnosis not present

## 2024-04-02 DIAGNOSIS — I77819 Aortic ectasia, unspecified site: Secondary | ICD-10-CM | POA: Insufficient documentation

## 2024-04-02 HISTORY — PX: CHOLECYSTECTOMY: SHX55

## 2024-04-02 SURGERY — LAPAROSCOPIC CHOLECYSTECTOMY
Anesthesia: General

## 2024-04-02 MED ORDER — INDOCYANINE GREEN 25 MG IV SOLR
2.5000 mg | Freq: Once | INTRAVENOUS | Status: AC
  Administered 2024-04-02: 2.5 mg via INTRAVENOUS
  Filled 2024-04-02: qty 10

## 2024-04-02 MED ORDER — SUGAMMADEX SODIUM 200 MG/2ML IV SOLN
INTRAVENOUS | Status: DC | PRN
  Administered 2024-04-02: 200 mg via INTRAVENOUS

## 2024-04-02 MED ORDER — CEFAZOLIN SODIUM-DEXTROSE 2-4 GM/100ML-% IV SOLN
2.0000 g | INTRAVENOUS | Status: AC
  Administered 2024-04-02: 2 g via INTRAVENOUS
  Filled 2024-04-02: qty 100

## 2024-04-02 MED ORDER — OXYCODONE HCL 5 MG PO TABS
ORAL_TABLET | ORAL | Status: AC
  Filled 2024-04-02: qty 1

## 2024-04-02 MED ORDER — LIDOCAINE HCL (PF) 2 % IJ SOLN
INTRAMUSCULAR | Status: DC | PRN
  Administered 2024-04-02: 100 mg via INTRADERMAL

## 2024-04-02 MED ORDER — OXYCODONE HCL 5 MG PO TABS: 5.0000 mg | ORAL_TABLET | ORAL | Status: DC | PRN

## 2024-04-02 MED ORDER — GABAPENTIN 300 MG PO CAPS
300.0000 mg | ORAL_CAPSULE | ORAL | Status: AC
Start: 2024-04-02 — End: 2024-04-02
  Administered 2024-04-02: 300 mg via ORAL
  Filled 2024-04-02: qty 1

## 2024-04-02 MED ORDER — MORPHINE SULFATE (PF) 2 MG/ML IV SOLN: 1.0000 mg | INTRAVENOUS | Status: DC | PRN

## 2024-04-02 MED ORDER — BUPIVACAINE-EPINEPHRINE (PF) 0.25% -1:200000 IJ SOLN
INTRAMUSCULAR | Status: AC
  Filled 2024-04-02: qty 30

## 2024-04-02 MED ORDER — CHLORHEXIDINE GLUCONATE CLOTH 2 % EX PADS: 6.0000 | MEDICATED_PAD | Freq: Once | CUTANEOUS | Status: DC

## 2024-04-02 MED ORDER — MIDAZOLAM HCL 5 MG/5ML IJ SOLN
INTRAMUSCULAR | Status: DC | PRN
  Administered 2024-04-02: 2 mg via INTRAVENOUS

## 2024-04-02 MED ORDER — DROPERIDOL 2.5 MG/ML IJ SOLN
0.6250 mg | Freq: Once | INTRAMUSCULAR | Status: DC | PRN
Start: 2024-04-02 — End: 2024-04-02

## 2024-04-02 MED ORDER — LIDOCAINE 20MG/ML (2%) 15 ML SYRINGE OPTIME
INTRAMUSCULAR | Status: DC | PRN
  Administered 2024-04-02: 1.5 mg/kg/h via INTRAVENOUS

## 2024-04-02 MED ORDER — SUCCINYLCHOLINE CHLORIDE 200 MG/10ML IV SOSY
PREFILLED_SYRINGE | INTRAVENOUS | Status: DC | PRN
  Administered 2024-04-02: 100 mg via INTRAVENOUS

## 2024-04-02 MED ORDER — ACETAMINOPHEN 325 MG PO TABS: 650.0000 mg | ORAL_TABLET | ORAL | Status: DC | PRN

## 2024-04-02 MED ORDER — SODIUM CHLORIDE 0.9 % IV SOLN: 250.0000 mL | INTRAVENOUS | Status: DC | PRN

## 2024-04-02 MED ORDER — MIDAZOLAM HCL 2 MG/2ML IJ SOLN
INTRAMUSCULAR | Status: AC
  Filled 2024-04-02: qty 2

## 2024-04-02 MED ORDER — SODIUM CHLORIDE 0.9% FLUSH: 3.0000 mL | INTRAVENOUS | Status: DC | PRN

## 2024-04-02 MED ORDER — ONDANSETRON HCL 4 MG/2ML IJ SOLN
INTRAMUSCULAR | Status: DC | PRN
  Administered 2024-04-02: 4 mg via INTRAVENOUS

## 2024-04-02 MED ORDER — CHLORHEXIDINE GLUCONATE 0.12 % MT SOLN
15.0000 mL | Freq: Once | OROMUCOSAL | Status: AC
  Administered 2024-04-02: 15 mL via OROMUCOSAL

## 2024-04-02 MED ORDER — ONDANSETRON HCL 4 MG/2ML IJ SOLN: 4.0000 mg | Freq: Once | INTRAMUSCULAR | Status: DC | PRN

## 2024-04-02 MED ORDER — IBUPROFEN 200 MG PO TABS
600.0000 mg | ORAL_TABLET | Freq: Four times a day (QID) | ORAL | 0 refills | Status: AC
  Filled 2024-04-02: qty 75, 7d supply, fill #0

## 2024-04-02 MED ORDER — ORAL CARE MOUTH RINSE: 15.0000 mL | Freq: Once | OROMUCOSAL | Status: AC

## 2024-04-02 MED ORDER — OXYCODONE HCL 5 MG PO TABS
5.0000 mg | ORAL_TABLET | Freq: Three times a day (TID) | ORAL | 0 refills | Status: AC | PRN
  Filled 2024-04-02: qty 8, 3d supply, fill #0

## 2024-04-02 MED ORDER — ACETAMINOPHEN 10 MG/ML IV SOLN
1000.0000 mg | Freq: Once | INTRAVENOUS | Status: DC | PRN
Start: 2024-04-02 — End: 2024-04-02

## 2024-04-02 MED ORDER — ACETAMINOPHEN 325 MG PO TABS
650.0000 mg | ORAL_TABLET | Freq: Four times a day (QID) | ORAL | 0 refills | Status: AC
  Filled 2024-04-02: qty 50, 7d supply, fill #0

## 2024-04-02 MED ORDER — ACETAMINOPHEN 650 MG RE SUPP
650.0000 mg | RECTAL | Status: DC | PRN
  Filled 2024-04-02: qty 1

## 2024-04-02 MED ORDER — ROCURONIUM BROMIDE 100 MG/10ML IV SOLN
INTRAVENOUS | Status: DC | PRN
  Administered 2024-04-02: 40 mg via INTRAVENOUS

## 2024-04-02 MED ORDER — OXYCODONE HCL 5 MG PO TABS
5.0000 mg | ORAL_TABLET | Freq: Once | ORAL | Status: AC | PRN
  Administered 2024-04-02: 5 mg via ORAL

## 2024-04-02 MED ORDER — LACTATED RINGERS IV SOLN: INTRAVENOUS | Status: DC

## 2024-04-02 MED ORDER — FENTANYL CITRATE (PF) 50 MCG/ML IJ SOSY
PREFILLED_SYRINGE | INTRAMUSCULAR | Status: AC
  Filled 2024-04-02: qty 1

## 2024-04-02 MED ORDER — PROPOFOL 10 MG/ML IV BOLUS
INTRAVENOUS | Status: DC | PRN
  Administered 2024-04-02: 30 mg via INTRAVENOUS
  Administered 2024-04-02: 20 mg via INTRAVENOUS
  Administered 2024-04-02: 30 mg via INTRAVENOUS
  Administered 2024-04-02: 120 mg via INTRAVENOUS

## 2024-04-02 MED ORDER — LACTATED RINGERS IR SOLN
Status: DC | PRN
Start: 2024-04-02 — End: 2024-04-02
  Administered 2024-04-02: 1000 mL

## 2024-04-02 MED ORDER — EPHEDRINE SULFATE (PRESSORS) 25 MG/5ML IV SOSY
PREFILLED_SYRINGE | INTRAVENOUS | Status: DC | PRN
  Administered 2024-04-02: 15 mg via INTRAVENOUS

## 2024-04-02 MED ORDER — BUPIVACAINE-EPINEPHRINE 0.25% -1:200000 IJ SOLN
INTRAMUSCULAR | Status: DC | PRN
  Administered 2024-04-02: 30 mL

## 2024-04-02 MED ORDER — SODIUM CHLORIDE 0.9% FLUSH: 3.0000 mL | Freq: Two times a day (BID) | INTRAVENOUS | Status: DC

## 2024-04-02 MED ORDER — OXYCODONE HCL 5 MG/5ML PO SOLN: 5.0000 mg | Freq: Once | ORAL | Status: AC | PRN

## 2024-04-02 MED ORDER — FENTANYL CITRATE (PF) 100 MCG/2ML IJ SOLN
INTRAMUSCULAR | Status: DC | PRN
  Administered 2024-04-02 (×2): 50 ug via INTRAVENOUS

## 2024-04-02 MED ORDER — FENTANYL CITRATE (PF) 100 MCG/2ML IJ SOLN
INTRAMUSCULAR | Status: AC
  Filled 2024-04-02: qty 2

## 2024-04-02 MED ORDER — FENTANYL CITRATE (PF) 50 MCG/ML IJ SOSY
25.0000 ug | PREFILLED_SYRINGE | INTRAMUSCULAR | Status: DC | PRN
  Administered 2024-04-02: 50 ug via INTRAVENOUS

## 2024-04-02 MED ORDER — DEXAMETHASONE SOD PHOSPHATE PF 10 MG/ML IJ SOLN
INTRAMUSCULAR | Status: DC | PRN
  Administered 2024-04-02: 10 mg via INTRAVENOUS

## 2024-04-02 MED ORDER — LABETALOL HCL 5 MG/ML IV SOLN
INTRAVENOUS | Status: DC | PRN
  Administered 2024-04-02: 10 mg via INTRAVENOUS

## 2024-04-02 MED ORDER — ACETAMINOPHEN 500 MG PO TABS
1000.0000 mg | ORAL_TABLET | ORAL | Status: AC
  Administered 2024-04-02: 1000 mg via ORAL
  Filled 2024-04-02: qty 2

## 2024-04-02 SURGICAL SUPPLY — 33 items
CABLE HIGH FREQUENCY MONO STRZ (ELECTRODE) IMPLANT
CATH CHOLANG 76X19 KUMAR (CATHETERS) IMPLANT
CHLORAPREP W/TINT 26 (MISCELLANEOUS) ×1 IMPLANT
CLIP APPLIE 5 13 M/L LIGAMAX5 (MISCELLANEOUS) IMPLANT
CLIP APPLIE ROT 10 11.4 M/L (STAPLE) IMPLANT
COVER MAYO STAND XLG (MISCELLANEOUS) IMPLANT
COVER SURGICAL LIGHT HANDLE (MISCELLANEOUS) ×1 IMPLANT
DERMABOND ADVANCED .7 DNX12 (GAUZE/BANDAGES/DRESSINGS) ×1 IMPLANT
DRAPE C-ARM 42X120 X-RAY (DRAPES) IMPLANT
ELECT PENCIL ROCKER SW 15FT (MISCELLANEOUS) ×1 IMPLANT
ELECT REM PT RETURN 15FT ADLT (MISCELLANEOUS) ×1 IMPLANT
ENDOLOOP SUT PDS II 0 18 (SUTURE) ×1 IMPLANT
GLOVE BIO SURGEON STRL SZ7 (GLOVE) ×1 IMPLANT
GOWN STRL REUS W/ TWL XL LVL3 (GOWN DISPOSABLE) ×1 IMPLANT
GRASPER SUT TROCAR 14GX15 (MISCELLANEOUS) IMPLANT
HEMOSTAT SNOW SURGICEL 2X4 (HEMOSTASIS) IMPLANT
IRRIGATION SUCT STRKRFLW 2 WTP (MISCELLANEOUS) IMPLANT
KIT BASIN OR (CUSTOM PROCEDURE TRAY) ×1 IMPLANT
KIT TURNOVER KIT A (KITS) ×1 IMPLANT
LHOOK LAP DISP 36CM (ELECTROSURGICAL) ×1 IMPLANT
NDL INSUFFLATION 14GA 120MM (NEEDLE) ×1 IMPLANT
NEEDLE INSUFFLATION 14GA 120MM (NEEDLE) ×1 IMPLANT
POUCH RETRIEVAL ECOSAC 10 (ENDOMECHANICALS) ×1 IMPLANT
SCISSORS LAP 5X35 DISP (ENDOMECHANICALS) ×1 IMPLANT
SET TUBE SMOKE EVAC HIGH FLOW (TUBING) ×1 IMPLANT
SLEEVE Z-THREAD 5X100MM (TROCAR) ×2 IMPLANT
SPIKE FLUID TRANSFER (MISCELLANEOUS) ×1 IMPLANT
SUT MNCRL AB 4-0 PS2 18 (SUTURE) ×1 IMPLANT
SUT VICRYL 0 UR6 27IN ABS (SUTURE) ×1 IMPLANT
TOWEL OR 17X26 10 PK STRL BLUE (TOWEL DISPOSABLE) ×1 IMPLANT
TRAY LAPAROSCOPIC (CUSTOM PROCEDURE TRAY) ×1 IMPLANT
TROCAR ADV FIXATION 12X100MM (TROCAR) ×1 IMPLANT
TROCAR Z-THREAD OPTICAL 5X100M (TROCAR) ×1 IMPLANT

## 2024-04-02 NOTE — Anesthesia Procedure Notes (Signed)
 Procedure Name: Intubation Date/Time: 04/02/2024 7:45 AM  Performed by: Emilio Rock DEL, CRNAPre-anesthesia Checklist: Patient identified, Emergency Drugs available, Suction available, Patient being monitored and Timeout performed Patient Re-evaluated:Patient Re-evaluated prior to induction Oxygen Delivery Method: Circle system utilized Preoxygenation: Pre-oxygenation with 100% oxygen Induction Type: IV induction and Rapid sequence Laryngoscope Size: Miller and 2 Grade View: Grade I Tube type: Oral Tube size: 7.5 mm Number of attempts: 1 Airway Equipment and Method: Stylet Placement Confirmation: positive ETCO2, ETT inserted through vocal cords under direct vision, CO2 detector and breath sounds checked- equal and bilateral Secured at: 23 cm Tube secured with: Tape Dental Injury: Teeth and Oropharynx as per pre-operative assessment

## 2024-04-02 NOTE — Op Note (Signed)
 Patient: Daniel Krueger MRN: [id number] DOB: 1950/11/29 Sex: male Operation/Procedure Date: 04/02/2024  Surgeons and Role: Idler Cordella LABOR, MD - Primary Resident: Margretta Brash, MD  Pre-operative Diagnoses: SYMPTOMATIC CHOLELITHIASIS Postoperative Diagnoses: SYMPTOMATIC CHOLELITHIASIS  Procedure performed: Procedure:    LAPAROSCOPIC CHOLECYSTECTOMY WITH ICG DYE CPT(R) Code:  52437 - PR LAPAROSCOPY SURG CHOLECYSTECTOMY  Anesthesia: General endotracheal anesthesia  Indications: Daniel Krueger is a 73 year old male who was evaluated in the office for abdominal pain. His workup was consistent with symptomatic cholelithiasis and I offered him an cholecystectomy. Preoperatively, I discussed in detail the risks, benefits, alternatives, and potential complications. The patient understands and requests to proceed.  Operative Findings: Critical view obtained prior to placing clips.  Operative Narrative: The patient was positively identified and was taken to the operating room and placed supine on the operating table. A time-out was performed confirming correct patient and procedure. We also confirmed initiation of deep venous thrombosis prophylaxis and wound prophylaxis. After successful induction of general endotracheal anesthesia, the arms were carefully padded. An orogastric tube and footboard were placed. The abdomen was prepped and draped in the usual sterile surgical fashion.  We began our peritoneal access with a veress needle inserted at Palmer's point.  After aspiration showed return of air bubbles and there was a positive saline drop test, the insufflation was connected and the abdomen brought to a pressure of .  We then used an opti-view technique to place a 5mm port just to the right of midline, superior to the umbilicus.  A laparoscope was introduced into the abdomen, and there were no signs of injury from entry.  A 12mm port was placed in the subxiphoid position.  Two additional  5mm ports were placed in the RUQ.  A 360-degree visualization with a 30-degree 5-mm laparoscope revealed grossly normal intra-abdominal contents.   The patient was placed in the head up position and tilted slightly to the left.The infundibulum was retracted laterally and inferiorly exposing Calot's triangle. The investing visceral peritoneal attachments overlying the infundibulum of the gallbladder were incised using the hook electrocautery and dissected free from the gallbladder itself. We soon developed two structures into the gallbladder consistent with the cystic duct and cystic artery. The loose areolar tissue around these structures was dissected free. The gallbladder was separated from the gallbladder fossa for approximately a third the distance up from the cystic plate, establishing the critical view of safety. We then transitioned to infrared viewing mode to allow for visualization of the ICG tracer. There was tracer throughout the liver. There was tracer within the candidate cystic duct as well as in a separate structure medially to the duct, consistent with where the common bile duct would be expected.  There was no tracer within the candidate cystic artery.  There was tracer within the duodenum, indicating no presence of a biliary obstruction. We returned to normal viewing mode. The cystic duct and cystic artery were triply clipped. These were divided using laparoscopic scissors leaving a single clip on the removal side. The gallbladder was elevated off the gallbladder fossa using the hook Bovie. The gallbladder was then exteriorized through the subxiphoid port site using an EndoCatch bag. We reestablished pneumoperitoneum and confirmed no leakage of blood or bile. We confirmed integrity of our clips. The subhepatic space was irrigated with warm sterile saline and suctioned free. The 12mm port site was closed using an 0-vicryl on a suture passer.We placed 0.25% Marcaine  with epinephrine  at each  incision site for local anesthesia.  The skin was closed using 4-0 Monocryl subcuticular suture. Dermabond was applied. The patient tolerated the procedure well, was extubated, and taken to the recovery room.  Estimated Blood Loss: 25 mL Specimens: Gallbladder Implants: None Drains: None Complications: None Condition of the patient: Good, extubated Disposition: PACU  Cordella DELENA Idler Date: 04/02/2024 Time: 8:46 AM

## 2024-04-02 NOTE — H&P (Signed)
     Daniel Krueger Oct 15, 1950  [id number].    HPI:  73 y/o M with biliary disease who presents for elective cholecystectomy. He reports that he is his usual state of health and denies any recent changes in medications.   ROS: Review of Systems  Constitutional: Negative.   HENT: Negative.    Eyes: Negative.   Respiratory: Negative.    Cardiovascular: Negative.   Gastrointestinal: Negative.   Genitourinary: Negative.   Musculoskeletal: Negative.   Skin: Negative.   Neurological: Negative.   Endo/Heme/Allergies: Negative.   Psychiatric/Behavioral: Negative.      History reviewed. No pertinent family history.  Past Medical History:  Diagnosis Date   Heart murmur    At age 73   Hypertension     Past Surgical History:  Procedure Laterality Date   CARDIAC CATHETERIZATION     they found nothing   HERNIA REPAIR     XI ROBOTIC ASSISTED INGUINAL HERNIA REPAIR WITH MESH Right 03/31/2023   Procedure: XI ROBOTIC ASSISTED INGUINAL HERNIA REPAIR WITH MESH;  Surgeon: Idler Cordella LABOR, MD;  Location: [facility];  Service: General;  Laterality: Right;    Social History:  reports that he quit smoking about 40 years ago. His smoking use included cigarettes. He has been exposed to tobacco smoke. He has never used smokeless tobacco. He reports that he does not drink alcohol and does not use drugs.  Allergies: No Known Allergies  Medications Prior to Admission  Medication Sig Dispense Refill   acetaminophen  (TYLENOL ) 500 MG tablet Take 500-1,000 mg by mouth every 6 (six) hours as needed (pain.).     atorvastatin (LIPITOR) 20 MG tablet Take 20 mg by mouth in the morning.     fluticasone (FLONASE) 50 MCG/ACT nasal spray Place 1 spray into both nostrils daily as needed for allergies.     hydrochlorothiazide (HYDRODIURIL) 12.5 MG tablet Take 12.5 mg by mouth every morning.     ibuprofen  (ADVIL ) 200 MG tablet Take 200-600 mg by mouth every 8 (eight) hours as needed (pain.).     lisinopril  (ZESTRIL) 40 MG tablet Take 40 mg by mouth daily.     Olopatadine HCl (PATADAY OP) Place 1-2 drops into both eyes daily as needed (allergies.).     Polyethyl Glycol-Propyl Glycol (SYSTANE) 0.4-0.3 % SOLN Place 1-2 drops into both eyes 3 (three) times daily as needed (dry/irritated eyes.).      Physical Exam: Blood pressure (!) 162/89, pulse 77, temperature 98.1 F (36.7 C), temperature source Oral, resp. rate 18, height 5' 10 (1.778 m), weight 97.1 kg, SpO2 96%. Gen: male, NAD Abd: soft, non-distended  No results found for this or any previous visit (from the past 48 hours). No results found.  Assessment/Plan 73 y/o M with biliary disease  - Will proceed to the OR. We discussed the alternatives and potential risks of surgery, including but not limited to: bleeding, infection, damage to bowel or surrounding structures, bile leak, pancreatitis, retained stone, damage to the biliary system, and need for additional procedures. All questions were addressed and consent was obtained.    Cordella LABOR Idler [facility] 04/02/2024, 7:07 AM Please see Amion for pager number during day hours 7:00am-4:30pm or 7:00am -11:30am on weekends

## 2024-04-02 NOTE — Discharge Instructions (Signed)
 Home Care After Gallbladder Removal  Activity  Limit activity for the first 24 hours, then you may return to normal daily activities. Returning to normal daily activities as soon as you can following surgery will enhance recovery time.  No heavy lifting pushing or pulling, anything heavier than 10 pounds (gallon of milk weighs approx. 8.8 pounds) for 2 weeks from surgery date.  Do not mow the lawn, use a vacuum cleaner, or do any other strenuous activities without first consulting your surgical team.  Climb stairs slowly and watch your step.  Walk as often as you feel able to increase strength and endurance.  No driving or operating heavy machinery within 24 hours of taking narcotic pain medication.  Diet  Drink plenty of fluids and eat a light meal on the night of surgery. Some patients may find their appetite is poor for a week or two after surgery. This is a normal result of the stress of surgery-your appetite will return in time.   There are no specific diet restrictions after surgery and you resume regular diet as tolerated. However, you may want to refrain from fatty, heavy, and/or greasy foods and follow a low fat diet for 2-4 weeks to avoid temporary diarrhea and nausea. Slowly add fats back into diet.   If you have diarrhea, try avoiding spicy foods, dairy products, fatty foods, and alcohol . You can also watch to see if specific foods cause it, and stop eating them. If the diarrhea continues for more than 2 weeks, talk to your doctor.  Dressings and Wound Care  Keep your wound or incision site clean and dry.  You may have different types of dressings covering your incisions depending on your operation and your surgeon: Dermabond/Durabond (skin glue): This will usually remain in place for 10-14 days, then naturally fall off your skin. You may take a shower 24 hrs after surgery, carefully wash, not scrub the incision site with a mild non-scented soap. Pat dry with a soft towel.  Do not pick or  peel skin glue off.  You can shower and let the water fall on the dressings above. Do not soak or submerge your incision(s) in a bath tub, hot tub, or swimming pool, until your doctor says it is ok to do so or the incision(s) have completely healed, usually about 2-4 weeks.  Do not use creams, powder, salves or balms on your incision(s).  What to Expect After Surgery  Moderate discomfort controlled with medications  You may feel pain in one or both shoulders. This pain comes from the gas still left in your belly after the surgery, if you had laparoscopic surgery (several small incisions). The pain should ease over several days to a week. Ambulation will help with this pain.   Minimal drainage from incision  Belly swelling  Feeling fatigue and weak  Loose stools after eating. This may occur for 4-8 weeks; however longer in some cases.   Constipation after surgery is common. Drink plenty fluids and eat a high fiber diet.   Pain Control: Prescribed Non-Narcotic Pain Medication  You will be given three prescriptions.  Two of them will be for prescription strength ibuprofen (i.e. Advil) and prescription strength acetaminophen  (i.e. Tylenol ).  The vast majority of patients will just need these two medications.  One prescription will be for a 'rescue' prescription of an oral narcotic (oxycodone ).  You may fill this if needed.  You will alternate taking the ibuprofen (600mg ) every 6 hours and also the acetaminophen  (650mg )  every 6 hours so that you are taking one of those medications every 3 hours.  For example: o 0800 - take ibuprofen 600mg  o 1100 - take acetaminophen  650mg  o 1400 - take ibuprofen 600mg  o 1700 - take acetaminophen  650mg  o Etc.  Continue taking this alternating pattern of ibuprofen and acetaminophen  for 3 days  If you cannot take one or the other of these medications, just take the one you can every 6 hours.  If you are comfortable at night, you don't have to wake up and take a  medication.  If you are still uncomfortable after taking either ibuprofen or acetaminophen , try gentle stretching exercise and ice packs (a bag of frozen vegetables works great).  If you are still uncomfortable, you may fill the narcotic prescription of Oxycodone  and take as directed.  Once you have completed these prescriptions, your pain level should be low enough to stop taking medications altogether or just use an over the counter medication (ibuprofen or acetaminophen ) as needed.   Pain Control: Over the Counter Medications to take as needed  Colace/Docusate: May be prescribed by your surgeon to prevent constipation caused by the combination of narcotics, effects of anesthesia, and decreased ambulation.  Hold for loose stools or diarrhea. Take 100 mg 1-2 times a day starting tonight.   Fiber: High fiber foods, extra liquids (water 9-13 cups/day) can also assist with constipation. Examples of high fiber foods are fruit, bran. Prune juice and water are also good liquids to drink.  Milk of Magnesia/Miralax :  If constipated despite take the over the counter stool softeners, you may take Milk of Magnesia or Miralax  as directed on bottle to assist with constipation.     Pepcid/Famotidine: May be prescribed while taking naproxen (Aleve) or other NSAIDs such as ibuprofen (Motrin/Advil) to prevent stomach upset or Acid-reflux symptoms. Take 1 tablet 1-2 times a day.   **Constipation: The first bowel movement may occur anywhere between 1-5 days after surgery.  As long as you are not nauseated or not having significant abdominal pain this variation is acceptable. Narcotic pain medications can cause constipation increasing discomfort; early discontinuation will assist with bowel management.If constipated despite taking stool softeners,  you may take Milk of Magnesia or Miralax  as directed on the bottle.     **Home medications: You may restart your home medications as directed by your respective Primary Care  Physician or Surgeon.   When to notify your Doctor or Healthcare Team  Sign of Wound Infection   Fever over 100 degrees.  Wound becomes extremely swollen, shows red streaks, warm to the touch, and/or drainage from the incision site or foul-smelling drainage.  Wound edges separate or opens up  Bleeding or bruising   If you have bleeding, apply pressure to the site and hold the pressure firmly for 5 minutes. If the bleeding continues, apply pressure again and call 911. If the bleeding stopped, call your doctor to report it.   Call your doctor or nurse if you have increased bleeding from your site and increased bruising or a lump forms or gets larger under your skin at the site.  Unrelieved Pain    Call your doctor or nurse if your pain gets worse or is not eased 1 hour after taking your pain medicine, or if it is severe and uncontrolled.  Fever, Flu-like symptoms   Fever over 100 degrees and/or chills  Gastrointestinal Symptoms    If you have yellowing of your eyes or skin (jaundice)  If you have  dark or rust-colored urine  If your stool is gray colored   If you have nausea and vomiting that continues more than 24 hours, will not let you keep medicine down and will not let you keep fluids down  Black tarry bowel movements. This can be normal after surgery on the stomach, but should resolve in a day or two.    Call 911 if you suddenly have signs of blood loss such as:  Vomiting blood  Fast heart rate  Feeling faint, sweaty, or blacking out  Passing bright red blood from your rectum  Blood Clot Symptoms   Tender, swollen or reddened areas in your calf muscle or thighs.  Numbness or tingling in your lower leg or calf, or at the top of your leg or groin  Skin on your leg looks pale or blue or feels cold to touch  Chest pain or have trouble breathing, lightheadedness, fast heart rate  Sudden Onset of Symptoms    Call 911 if you suddenly have:  Leg weakness and spasm  Loss of bladder  or bowel function  Seizure  Confusion, severe headache, dizziness or feeling unsteady, problems talking, difficulty swallowing, and/or numbness or muscle weakness as these could be signs of a stroke.   Follow up Appointment Your follow up appointment should be scheduled 2-3 weeks after your surgery date.  If you have not previously scheduled for a follow-up visit you can be scheduled by contacting [phone]

## 2024-04-02 NOTE — Anesthesia Preprocedure Evaluation (Addendum)
 Anesthesia Evaluation  Patient identified by MRN, date of birth, ID band Patient awake    Reviewed: Allergy & Precautions, NPO status , Patient's Chart, lab work & pertinent test results  History of Anesthesia Complications Negative for: history of anesthetic complications  Airway Mallampati: II  TM Distance: >3 FB Neck ROM: Full    Dental  (+) Dental Advisory Given, Chipped, Teeth Intact   Pulmonary neg recent URI, former smoker   breath sounds clear to auscultation       Cardiovascular Exercise Tolerance: Good hypertension, Pt. on medications + Peripheral Vascular Disease (Dilated Ascending Aorta (2021))   Rhythm:Regular Rate:Normal  TTE (2021): 1. Left ventricular ejection fraction, by estimation, is 60 to 65%. The  left ventricle has normal function. LV endocardial border not optimally  defined to evaluate regional wall motion.   2. There is moderate dilatation of the ascending aorta measuring 41 mm.     Neuro/Psych neg Seizures    GI/Hepatic PUD,neg GERD  ,,Gallstones; Symptomatic Cholelithasis   Endo/Other  neg diabetes    Renal/GU Renal disease     Musculoskeletal   Abdominal   Peds  Hematology   Anesthesia Other Findings   Reproductive/Obstetrics                              Anesthesia Physical Anesthesia Plan  ASA: 2  Anesthesia Plan: General   Post-op Pain Management:    Induction: Intravenous  PONV Risk Score and Plan: 2 and Ondansetron , Dexamethasone  and Treatment may vary due to age or medical condition  Airway Management Planned: Oral ETT  Additional Equipment:   Intra-op Plan:   Post-operative Plan: Extubation in OR  Informed Consent:      Dental advisory given  Plan Discussed with: CRNA and Surgeon  Anesthesia Plan Comments:          Anesthesia Quick Evaluation

## 2024-04-02 NOTE — Transfer of Care (Signed)
 Immediate Anesthesia Transfer of Care Note  Patient: Daniel Krueger  Procedure(s) Performed: LAPAROSCOPIC CHOLECYSTECTOMY WITH ICG DYE  Patient Location: PACU  Anesthesia Type:General  Level of Consciousness: awake, sedated, drowsy, and patient cooperative  Airway & Oxygen Therapy: Patient Spontanous Breathing and Patient connected to face mask oxygen  Post-op Assessment: Report given to RN and Post -op Vital signs reviewed and stable  Post vital signs: stable  Last Vitals:  Vitals Value Taken Time  BP 155/97 04/02/24 09:00  Temp    Pulse 69 04/02/24 09:02  Resp 13 04/02/24 09:02  SpO2 97 % 04/02/24 09:02  Vitals shown include unfiled device data.  Last Pain:  Vitals:   04/02/24 0627  TempSrc: Oral  PainSc:          Complications: No notable events documented.

## 2024-04-02 NOTE — Anesthesia Postprocedure Evaluation (Signed)
 Anesthesia Post Note  Patient: Daniel HUGHETT  Procedure(s) Performed: LAPAROSCOPIC CHOLECYSTECTOMY WITH ICG DYE     Patient location during evaluation: PACU Anesthesia Type: General Level of consciousness: awake Pain management: pain level controlled Vital Signs Assessment: post-procedure vital signs reviewed and stable Respiratory status: spontaneous breathing Cardiovascular status: blood pressure returned to baseline Postop Assessment: no apparent nausea or vomiting Anesthetic complications: no   No notable events documented.  Last Vitals:  Vitals:   04/02/24 1015 04/02/24 1030  BP: (!) 148/92 (!) 135/98  Pulse: 62 (!) 58  Resp: 16   Temp: 36.6 C   SpO2: 92% 92%    Last Pain:  Vitals:   04/02/24 1130  TempSrc:   PainSc: 2                  Lauraine KATHEE [place]

## 2024-04-03 ENCOUNTER — Encounter (HOSPITAL_COMMUNITY): Payer: Self-pay | Admitting: General Surgery

## 2024-04-03 LAB — SURGICAL PATHOLOGY

## 2024-04-05 ENCOUNTER — Other Ambulatory Visit (HOSPITAL_COMMUNITY): Payer: Self-pay

## 2024-04-12 ENCOUNTER — Other Ambulatory Visit (HOSPITAL_COMMUNITY): Payer: Self-pay
